# Patient Record
Sex: Male | Born: 1945 | Race: White | Hispanic: No | Marital: Married | Smoking: Never smoker
Health system: Southern US, Community
[De-identification: ages and names within clinical notes are randomized; demographics above are authoritative.]

## PROBLEM LIST (undated history)

## (undated) DIAGNOSIS — G43109 Migraine with aura, not intractable, without status migrainosus: Secondary | ICD-10-CM

## (undated) DIAGNOSIS — R011 Cardiac murmur, unspecified: Secondary | ICD-10-CM

## (undated) DIAGNOSIS — K4090 Unilateral inguinal hernia, without obstruction or gangrene, not specified as recurrent: Secondary | ICD-10-CM

## (undated) DIAGNOSIS — N400 Enlarged prostate without lower urinary tract symptoms: Secondary | ICD-10-CM

## (undated) DIAGNOSIS — I69354 Hemiplegia and hemiparesis following cerebral infarction affecting left non-dominant side: Secondary | ICD-10-CM

## (undated) DIAGNOSIS — E785 Hyperlipidemia, unspecified: Secondary | ICD-10-CM

## (undated) DIAGNOSIS — N281 Cyst of kidney, acquired: Secondary | ICD-10-CM

## (undated) DIAGNOSIS — I5189 Other ill-defined heart diseases: Secondary | ICD-10-CM

## (undated) DIAGNOSIS — I639 Cerebral infarction, unspecified: Secondary | ICD-10-CM

## (undated) HISTORY — PX: OTHER SURGICAL HISTORY: SHX169

## (undated) HISTORY — PX: EYE SURGERY: SHX253

## (undated) HISTORY — PX: COLONOSCOPY W/ POLYPECTOMY: SHX1380

## (undated) HISTORY — PX: TOE SURGERY: SHX1073

---

## 2019-04-12 ENCOUNTER — Encounter: Payer: Self-pay | Admitting: Emergency Medicine

## 2019-04-12 ENCOUNTER — Inpatient Hospital Stay: Payer: Medicare Other

## 2019-04-12 ENCOUNTER — Emergency Department: Payer: Medicare Other

## 2019-04-12 ENCOUNTER — Other Ambulatory Visit: Payer: Self-pay

## 2019-04-12 ENCOUNTER — Inpatient Hospital Stay
Admission: EM | Admit: 2019-04-12 | Discharge: 2019-04-13 | DRG: 066 | Disposition: A | Discharge status: Home / Self Care | Payer: Medicare Other | Attending: Internal Medicine | Admitting: Internal Medicine

## 2019-04-12 DIAGNOSIS — Z823 Family history of stroke: Secondary | ICD-10-CM | POA: Diagnosis not present

## 2019-04-12 DIAGNOSIS — E785 Hyperlipidemia, unspecified: Secondary | ICD-10-CM | POA: Diagnosis present

## 2019-04-12 DIAGNOSIS — Z825 Family history of asthma and other chronic lower respiratory diseases: Secondary | ICD-10-CM | POA: Diagnosis not present

## 2019-04-12 DIAGNOSIS — Z833 Family history of diabetes mellitus: Secondary | ICD-10-CM

## 2019-04-12 DIAGNOSIS — Z8249 Family history of ischemic heart disease and other diseases of the circulatory system: Secondary | ICD-10-CM

## 2019-04-12 DIAGNOSIS — J302 Other seasonal allergic rhinitis: Secondary | ICD-10-CM | POA: Diagnosis present

## 2019-04-12 DIAGNOSIS — R29701 NIHSS score 1: Secondary | ICD-10-CM | POA: Diagnosis present

## 2019-04-12 DIAGNOSIS — R29810 Facial weakness: Secondary | ICD-10-CM | POA: Diagnosis present

## 2019-04-12 DIAGNOSIS — R531 Weakness: Secondary | ICD-10-CM | POA: Diagnosis present

## 2019-04-12 DIAGNOSIS — I63511 Cerebral infarction due to unspecified occlusion or stenosis of right middle cerebral artery: Secondary | ICD-10-CM | POA: Diagnosis present

## 2019-04-12 DIAGNOSIS — R42 Dizziness and giddiness: Secondary | ICD-10-CM

## 2019-04-12 DIAGNOSIS — I639 Cerebral infarction, unspecified: Secondary | ICD-10-CM

## 2019-04-12 LAB — URINALYSIS, ROUTINE W REFLEX MICROSCOPIC
Bacteria, UA: NONE SEEN
Bilirubin Urine: NEGATIVE
Glucose, UA: NEGATIVE mg/dL
Ketones, ur: NEGATIVE mg/dL
Leukocytes,Ua: NEGATIVE
Nitrite: NEGATIVE
Protein, ur: NEGATIVE mg/dL
Specific Gravity, Urine: 1.023 (ref 1.005–1.030)
Squamous Epithelial / HPF: NONE SEEN (ref 0–5)
pH: 5 (ref 5.0–8.0)

## 2019-04-12 LAB — URINE DRUG SCREEN, QUALITATIVE (ARMC ONLY)
Amphetamines, Ur Screen: NOT DETECTED
Barbiturates, Ur Screen: NOT DETECTED
Benzodiazepine, Ur Scrn: NOT DETECTED
Cannabinoid 50 Ng, Ur ~~LOC~~: NOT DETECTED
Cocaine Metabolite,Ur ~~LOC~~: NOT DETECTED
MDMA (Ecstasy)Ur Screen: NOT DETECTED
Methadone Scn, Ur: NOT DETECTED
Opiate, Ur Screen: NOT DETECTED
Phencyclidine (PCP) Ur S: NOT DETECTED
Tricyclic, Ur Screen: NOT DETECTED

## 2019-04-12 LAB — PROTIME-INR
INR: 1 (ref 0.8–1.2)
Prothrombin Time: 13 seconds (ref 11.4–15.2)

## 2019-04-12 LAB — CBC
HCT: 42.1 % (ref 39.0–52.0)
Hemoglobin: 14.3 g/dL (ref 13.0–17.0)
MCH: 30.6 pg (ref 26.0–34.0)
MCHC: 34 g/dL (ref 30.0–36.0)
MCV: 90.1 fL (ref 80.0–100.0)
Platelets: 174 10*3/uL (ref 150–400)
RBC: 4.67 MIL/uL (ref 4.22–5.81)
RDW: 12.6 % (ref 11.5–15.5)
WBC: 4.4 10*3/uL (ref 4.0–10.5)
nRBC: 0 % (ref 0.0–0.2)

## 2019-04-12 LAB — DIFFERENTIAL
Abs Immature Granulocytes: 0.01 10*3/uL (ref 0.00–0.07)
Basophils Absolute: 0 10*3/uL (ref 0.0–0.1)
Basophils Relative: 1 %
Eosinophils Absolute: 0.2 10*3/uL (ref 0.0–0.5)
Eosinophils Relative: 5 %
Immature Granulocytes: 0 %
Lymphocytes Relative: 20 %
Lymphs Abs: 0.9 10*3/uL (ref 0.7–4.0)
Monocytes Absolute: 0.4 10*3/uL (ref 0.1–1.0)
Monocytes Relative: 8 %
Neutro Abs: 2.9 10*3/uL (ref 1.7–7.7)
Neutrophils Relative %: 66 %

## 2019-04-12 LAB — COMPREHENSIVE METABOLIC PANEL
ALT: 21 U/L (ref 0–44)
AST: 22 U/L (ref 15–41)
Albumin: 4.6 g/dL (ref 3.5–5.0)
Alkaline Phosphatase: 56 U/L (ref 38–126)
Anion gap: 6 (ref 5–15)
BUN: 22 mg/dL (ref 8–23)
CO2: 25 mmol/L (ref 22–32)
Calcium: 9.3 mg/dL (ref 8.9–10.3)
Chloride: 107 mmol/L (ref 98–111)
Creatinine, Ser: 1.14 mg/dL (ref 0.61–1.24)
GFR calc Af Amer: >60 mL/min (ref >60)
GFR calc non Af Amer: >60 mL/min (ref >60)
Glucose, Bld: 161 mg/dL — ABNORMAL HIGH (ref 70–99)
Potassium: 3.9 mmol/L (ref 3.5–5.1)
Sodium: 138 mmol/L (ref 135–145)
Total Bilirubin: 1.1 mg/dL (ref 0.3–1.2)
Total Protein: 7.3 g/dL (ref 6.5–8.1)

## 2019-04-12 LAB — APTT: aPTT: 36 seconds (ref 24–36)

## 2019-04-12 LAB — ETHANOL: Alcohol, Ethyl (B): <10 mg/dL (ref <10)

## 2019-04-12 MED ORDER — ACETAMINOPHEN 160 MG/5ML PO SOLN
650.0000 mg | ORAL | Status: DC | PRN
  Filled 2019-04-12: qty 20.3

## 2019-04-12 MED ORDER — ASPIRIN 300 MG RE SUPP: 300.0000 mg | Freq: Every day | RECTAL | Status: DC

## 2019-04-12 MED ORDER — STROKE: EARLY STAGES OF RECOVERY BOOK
Freq: Once | Status: AC
  Administered 2019-04-12: 12:00:00

## 2019-04-12 MED ORDER — ACETAMINOPHEN 650 MG RE SUPP: 650.0000 mg | RECTAL | Status: DC | PRN

## 2019-04-12 MED ORDER — ENOXAPARIN SODIUM 40 MG/0.4ML ~~LOC~~ SOLN
40.0000 mg | SUBCUTANEOUS | Status: DC
  Administered 2019-04-13: 40 mg via SUBCUTANEOUS
  Filled 2019-04-12: qty 0.4

## 2019-04-12 MED ORDER — ACETAMINOPHEN 325 MG PO TABS: 650.0000 mg | ORAL_TABLET | ORAL | Status: DC | PRN

## 2019-04-12 MED ORDER — ASPIRIN 81 MG PO CHEW
324.0000 mg | CHEWABLE_TABLET | Freq: Once | ORAL | Status: AC
  Administered 2019-04-12: 09:00:00 324 mg via ORAL
  Filled 2019-04-12: qty 4

## 2019-04-12 MED ORDER — ASPIRIN 325 MG PO TABS
325.0000 mg | ORAL_TABLET | Freq: Every day | ORAL | Status: DC
  Administered 2019-04-13: 10:00:00 325 mg via ORAL
  Filled 2019-04-12 (×2): qty 1

## 2019-04-12 NOTE — ED Notes (Signed)
Pt taken to CT via stretcher.

## 2019-04-12 NOTE — ED Notes (Signed)
ED TO INPATIENT HANDOFF REPORT  ED Nurse Name and Phone #: Stephany Poorman [phone]  S Name/Age/Gender William Bennett 73 y.o. male Room/Bed: ED17A/ED17A  Code Status   Code Status: Full Code  Home/SNF/Other home A&O x 4 Is this baseline? yes  Triage Complete: Triage complete  Chief Complaint mouth drooping feeling off ballance  Triage Note Pt to ED via POV stating "I think I may have had a stroke yesterday". Pt has obvious droop on the left side of face. Pt reports that he had episode of dizziness yesterday, pt states that this happened around 0930. Pt also reports yesterday that he had trouble holding his left arm up and trouble holding his left leg up to put his shoe on. Pt has slightly decreased grip on the left side. Pt is in NAD at this time.    Allergies Allergies  Allergen Reactions  . Penicillins Rash    Did it involve swelling of the face/tongue/throat, SOB, or low BP? No Did it involve sudden or severe rash/hives, skin peeling, or any reaction on the inside of your mouth or nose? Yes Did you need to seek medical attention at a hospital or doctor's office? Unknown When did it last happen?"Years ago" If all above answers are "NO", may proceed with cephalosporin use.     Level of Care/Admitting Diagnosis ED Disposition    ED Disposition Condition Comment   Admit  Hospital Area: [facility] [[id number]]  Level of Care: Med-Surg [16]  Covid Evaluation: N/A  Diagnosis: Acute ischemic right MCA stroke [facility](HCC) [[id number]][[id number]]  Admitting Physician: Katha HammingKONIDENA, SNEHALATHA [[id number]]  Attending Physician: Katha HammingKONIDENA, SNEHALATHA [[id number]]  Estimated length of stay: past midnight tomorrow  Certification:: I certify this patient will need inpatient services for at least 2 midnights  PT Class (Do Not Modify): Inpatient [101]  PT Acc Code (Do Not Modify): Private [1]       B Medical/Surgery History History reviewed. No pertinent past medical history. Past Surgical  History:  Procedure Laterality Date  . thumb surgery       A IV Location/Drains/Wounds Patient Lines/Drains/Airways Status   Active Line/Drains/Airways    Name:   Placement date:   Placement time:   Site:   Days:   Peripheral IV 04/12/19 Left Antecubital   04/12/19    0816    Antecubital   less than 1          Intake/Output Last 24 hours No intake or output data in the 24 hours ending 04/12/19 1015  Labs/Imaging Results for orders placed or performed during the hospital encounter of 04/12/19 (from the past 48 hour(s))  Protime-INR     Status: None   Collection Time: 04/12/19  8:15 AM  Result Value Ref Range   Prothrombin Time 13.0 11.4 - 15.2 seconds   INR 1.0 0.8 - 1.2    Comment: (NOTE) INR goal varies based on device and disease states. Performed at [facility], [address] [place], [place] [address]   APTT     Status: None   Collection Time: 04/12/19  8:15 AM  Result Value Ref Range   aPTT 36 24 - 36 seconds    Comment: Performed at [facility], [address] [place], [place] [address]  CBC     Status: None   Collection Time: 04/12/19  8:15 AM  Result Value Ref Range   WBC 4.4 4.0 - 10.5 K/uL   RBC 4.67 4.22 - 5.81 MIL/uL   Hemoglobin 14.3 13.0 -  17.0 g/dL   HCT 40.9 81.1 - 91.4 %   MCV 90.1 80.0 - 100.0 fL   MCH 30.6 26.0 - 34.0 pg   MCHC 34.0 30.0 - 36.0 g/dL   RDW 78.2 95.6 - 21.3 %   Platelets 174 150 - 400 K/uL   nRBC 0.0 0.0 - 0.2 %    Comment: Performed at [facility], [address] [place], [place] [address]  Differential     Status: None   Collection Time: 04/12/19  8:15 AM  Result Value Ref Range   Neutrophils Relative % 66 %   Neutro Abs 2.9 1.7 - 7.7 K/uL   Lymphocytes Relative 20 %   Lymphs Abs 0.9 0.7 - 4.0 K/uL   Monocytes Relative 8 %   Monocytes Absolute 0.4 0.1 - 1.0 K/uL   Eosinophils Relative 5 %   Eosinophils Absolute 0.2 0.0 - 0.5 K/uL   Basophils Relative 1 %   Basophils Absolute 0.0 0.0  - 0.1 K/uL   Immature Granulocytes 0 %   Abs Immature Granulocytes 0.01 0.00 - 0.07 K/uL    Comment: Performed at [facility], [address] [place], [place] [address]  Comprehensive metabolic panel     Status: Abnormal   Collection Time: 04/12/19  8:15 AM  Result Value Ref Range   Sodium 138 135 - 145 mmol/L   Potassium 3.9 3.5 - 5.1 mmol/L   Chloride 107 98 - 111 mmol/L   CO2 25 22 - 32 mmol/L   Glucose, Bld 161 (H) 70 - 99 mg/dL   BUN 22 8 - 23 mg/dL   Creatinine, Ser 2.95 0.61 - 1.24 mg/dL   Calcium 9.3 8.9 - 28.4 mg/dL   Total Protein 7.3 6.5 - 8.1 g/dL   Albumin 4.6 3.5 - 5.0 g/dL   AST 22 15 - 41 U/L   ALT 21 0 - 44 U/L   Alkaline Phosphatase 56 38 - 126 U/L   Total Bilirubin 1.1 0.3 - 1.2 mg/dL   GFR calc non Af Amer >60 >60 mL/min   GFR calc Af Amer >60 >60 mL/min   Anion gap 6 5 - 15    Comment: Performed at [facility], [address], [place], [place] [address]   Ct Head Code Stroke Wo Contrast  Result Date: 04/12/2019 CLINICAL DATA:  Code stroke.  LEFT-sided facial droop. Dizziness. EXAM: CT HEAD WITHOUT CONTRAST TECHNIQUE: Contiguous axial images were obtained from the base of the skull through the vertex without intravenous contrast. COMPARISON:  None. FINDINGS: Brain: 5 mm area of hypodensity, in the region of the posterior limb internal capsule on the RIGHT, could represent acute infarct. Elsewhere, no definite acute cortical hypodensity, mass lesion, hemorrhage, hydrocephalus, or extra-axial fluid. Vascular: Calcification of the cavernous internal carotid arteries consistent with cerebrovascular atherosclerotic disease. No signs of intracranial large vessel occlusion. Skull: Intact. Sinuses/Orbits: No acute finding. Other: None. ASPECTS (Alberta Stroke Program Early CT Score) - Ganglionic level infarction (caudate, lentiform nuclei, internal capsule, insula, M1-M3 cortex): 6 - Supraganglionic infarction (M4-M6 cortex): 3 Total score (0-10 with 10  being normal): 9 IMPRESSION: 1. 5 mm area of hypodensity in the posterior limb internal capsule on the RIGHT could represent acute infarction. 2. ASPECTS is 9. 3. These results were called by telephone at the time of interpretation on 04/12/2019 at 9:14 am to Dr. Nita Sickle , who verbally acknowledged these results. * Electronically Signed   By: Elsie Stain M.D.   On: 04/12/2019  09:15    Pending Labs Unresulted Labs (From admission, onward)    Start     Ordered   04/19/19 0500  Creatinine, serum  (enoxaparin (LOVENOX)    CrCl >/= 30 ml/min)  Weekly,   STAT    Comments:  while on enoxaparin therapy    04/12/19 0945   04/13/19 0500  Hemoglobin A1c  Tomorrow morning,   STAT     04/12/19 0945   04/13/19 0500  Lipid panel  Tomorrow morning,   STAT    Comments:  Fasting    04/12/19 0945   04/12/19 0942  HIV antibody (Routine Testing)  Once,   STAT     04/12/19 0945   04/12/19 0942  CBC  (enoxaparin (LOVENOX)    CrCl >/= 30 ml/min)  Once,   STAT    Comments:  Baseline for enoxaparin therapy IF NOT ALREADY DRAWN.  Notify MD if PLT < 100 K.    04/12/19 0945   04/12/19 0942  Creatinine, serum  (enoxaparin (LOVENOX)    CrCl >/= 30 ml/min)  Once,   STAT    Comments:  Baseline for enoxaparin therapy IF NOT ALREADY DRAWN.    04/12/19 0945   04/12/19 0829  Ethanol  ONCE - STAT,   STAT     04/12/19 0830   04/12/19 0829  Urine Drug Screen, Qualitative  Once,   STAT     04/12/19 0830   04/12/19 0829  Urinalysis, Routine w reflex microscopic  ONCE - STAT,   STAT     04/12/19 0830          Vitals/Pain Today's Vitals   04/12/19 0806 04/12/19 0930 04/12/19 0945 04/12/19 1000  BP: (!) 143/94 (!) 160/84 133/70 136/75  Pulse: 86 78 (!) 59 (!) 57  Resp: 16 (!) 24 (!) 22 19  Temp: 97.9 F (36.6 C)     TempSrc: Oral     SpO2: 98% 97% 99% 98%  Weight: 79.4 kg     Height: 5\' 9"  (1.753 m)     PainSc: 0-No pain       Isolation Precautions No active isolations  Medications Medications    stroke: mapping our early stages of recovery book (has no administration in time range)  acetaminophen (TYLENOL) tablet 650 mg (has no administration in time range)    Or  acetaminophen (TYLENOL) solution 650 mg (has no administration in time range)    Or  acetaminophen (TYLENOL) suppository 650 mg (has no administration in time range)  enoxaparin (LOVENOX) injection 40 mg (has no administration in time range)  aspirin suppository 300 mg (has no administration in time range)    Or  aspirin tablet 325 mg (has no administration in time range)  aspirin chewable tablet 324 mg (324 mg Oral Given 04/12/19 0923)    Mobility  Low fall risk      R

## 2019-04-12 NOTE — ED Notes (Signed)
Floor contacted to notify them pt being transported

## 2019-04-12 NOTE — ED Notes (Addendum)
Pt transported to MRI at this time 

## 2019-04-12 NOTE — ED Notes (Signed)
Pt moved to room 126 by this tech.

## 2019-04-12 NOTE — ED Provider Notes (Signed)
[facility] Emergency Department Provider Note  ____________________________________________  Time seen: Approximately 9:19 AM  I have reviewed the triage vital signs and the nursing notes.   HISTORY  Chief Complaint possible CVA   HPI William Bennett is a 73 y.o. male with a history of hyperlipidemia and migraine headaches who presents for evaluation of left-sided facial droop and dizziness.  Patient reports that he felt off balance the entire day yesterday while packing his house for moving.  He noticed mild weakness on the left upper and lower extremity.  He does have a history of vertigo and attributed his symptoms to that.  This morning when he woke up he noted a left-sided facial droop for which prompted his visit to the emergency room.  He is still complaining of intermittent episodes of feeling off balance.  Denies slurred speech.  He has family history of stroke.  Denies any history of smoking.  Denies any personal history of stroke.  PMH HLD Migraine HA  Past Surgical History:  Procedure Laterality Date   thumb surgery      Allergies Penicillins  FH Asthma Father    Coronary Artery Disease (Blocked arteries around heart) Father    Coronary Artery Disease (Blocked arteries around heart) Mother    Diabetes type II Mother    High blood pressure (Hypertension) Mother    Depression Sister    TIA - mother   Social History Social History   Tobacco Use   Smoking status: Never Smoker   Smokeless tobacco: Never Used  Substance Use Topics   Alcohol use: Not Currently   Drug use: Not Currently    Review of Systems  Constitutional: Negative for fever. Eyes: Negative for visual changes. ENT: Negative for sore throat. Neck: No neck pain  Cardiovascular: Negative for chest pain. Respiratory: Negative for shortness of breath. Gastrointestinal: Negative for abdominal pain, vomiting or diarrhea. Genitourinary: Negative for  dysuria. Musculoskeletal: Negative for back pain. Skin: Negative for rash. Neurological: Negative for headaches. + vertigo, L sided facial droop. Psych: No SI or HI  ____________________________________________   PHYSICAL EXAM:  VITAL SIGNS: ED Triage Vitals [04/12/19 0806]  Enc Vitals Group     BP (!) 143/94     Pulse Rate 86     Resp 16     Temp 97.9 F (36.6 C)     Temp Source Oral     SpO2 98 %     Weight 175 lb (79.4 kg)     Height  (1.753 m)     Head Circumference      Peak Flow      Pain Score 0     Pain Loc      Pain Edu?      Excl. in GC?     Constitutional: Alert and oriented. Well appearing and in no apparent distress. HEENT:      Head: Normocephalic and atraumatic.         Eyes: Conjunctivae are normal. Sclera is non-icteric.       Mouth/Throat: Mucous membranes are moist.       Neck: Supple with no signs of meningismus. Cardiovascular: Regular rate and rhythm. No murmurs, gallops, or rubs. 2+ symmetrical distal pulses are present in all extremities. No JVD. Respiratory: Normal respiratory effort. Lungs are clear to auscultation bilaterally. No wheezes, crackles, or rhonchi.  Gastrointestinal: Soft, non tender, and non distended with positive bowel sounds. No rebound or guarding. Musculoskeletal: Nontender with normal range of motion in  all extremities. No edema, cyanosis, or erythema of extremities. Neurologic: Normal speech and language. R lower facial droop, forehead is spared, strength 5/5 x 4, normal sensation, no pronator drift or dysmetria, gait is intact Skin: Skin is warm, dry and intact. No rash noted. Psychiatric: Mood and affect are normal. Speech and behavior are normal.  ____________________________________________   LABS (all labs ordered are listed, but only abnormal results are displayed)  Labs Reviewed  COMPREHENSIVE METABOLIC PANEL - Abnormal; Notable for the following components:      Result Value   Glucose, Bld 161 (*)    All  other components within normal limits  PROTIME-INR  APTT  CBC  DIFFERENTIAL  ETHANOL  URINE DRUG SCREEN, QUALITATIVE ([facility] ONLY)  URINALYSIS, ROUTINE W REFLEX MICROSCOPIC  CBG MONITORING, ED   ____________________________________________  EKG  ED ECG REPORT I, Nita Sickle, the attending physician, personally viewed and interpreted this ECG.  Normal sinus rhythm, rate of 73, normal intervals, normal axis, no ST elevations or depressions.  Normal EKG. ____________________________________________  RADIOLOGY  I have personally reviewed the images performed during this visit and I agree with the Radiologist's read.   Interpretation by Radiologist:  Ct Head Code Stroke Wo Contrast  Result Date: 04/12/2019 CLINICAL DATA:  Code stroke.  LEFT-sided facial droop. Dizziness. EXAM: CT HEAD WITHOUT CONTRAST TECHNIQUE: Contiguous axial images were obtained from the base of the skull through the vertex without intravenous contrast. COMPARISON:  None. FINDINGS: Brain: 5 mm area of hypodensity, in the region of the posterior limb internal capsule on the RIGHT, could represent acute infarct. Elsewhere, no definite acute cortical hypodensity, mass lesion, hemorrhage, hydrocephalus, or extra-axial fluid. Vascular: Calcification of the cavernous internal carotid arteries consistent with cerebrovascular atherosclerotic disease. No signs of intracranial large vessel occlusion. Skull: Intact. Sinuses/Orbits: No acute finding. Other: None. ASPECTS (Alberta Stroke Program Early CT Score) - Ganglionic level infarction (caudate, lentiform nuclei, internal capsule, insula, M1-M3 cortex): 6 - Supraganglionic infarction (M4-M6 cortex): 3 Total score (0-10 with 10 being normal): 9 IMPRESSION: 1. 5 mm area of hypodensity in the posterior limb internal capsule on the RIGHT could represent acute infarction. 2. ASPECTS is 9. 3. These results were called by telephone at the time of interpretation on 04/12/2019 at 9:14  am to Dr. Nita Sickle , who verbally acknowledged these results. * Electronically Signed   By: Elsie Stain M.D.   On: 04/12/2019 09:15     ____________________________________________   PROCEDURES  Procedure(s) performed: None Procedures Critical Care performed:  None ____________________________________________   INITIAL IMPRESSION / ASSESSMENT AND PLAN / ED COURSE  73 y.o. male with a history of hyperlipidemia and migraine headaches who presents for evaluation of left-sided facial droop and dizziness. Last normal was sometime yesterday. Patient has lower facial droop on the left with no other neuro deficits. CT concerning for acute R sided internal capsule stroke. Will give ASA. Labs showing mildly elevated BG 161. EKG with no evidence of arrhythmia. Will admit to the hospitalist service for stroke evaluation.      As part of my medical decision making, I reviewed the following data within the electronic MEDICAL RECORD NUMBER Nursing notes reviewed and incorporated, Labs reviewed , EKG interpreted , Old chart reviewed, Radiograph reviewed , Discussed with admitting physician , Notes from prior ED visits and [place] Controlled Substance Database    Pertinent labs & imaging results that were available during my care of the patient were reviewed by me and considered in my medical decision making (  see chart for details).    ____________________________________________   FINAL CLINICAL IMPRESSION(S) / ED DIAGNOSES  Final diagnoses:  Cerebrovascular accident (CVA), unspecified mechanism (HCC)      NEW MEDICATIONS STARTED DURING THIS VISIT:  ED Discharge Orders    None       Note:  This document was prepared using Dragon voice recognition software and may include unintentional dictation errors.    Don PerkingVeronese, [place], MD 04/12/19 [phone]

## 2019-04-12 NOTE — Evaluation (Signed)
Physical Therapy Evaluation Patient Details Name: William Bennett MRN: [id number] DOB: 1946/02/03 Today's Date: 04/12/2019   History of Present Illness  Patient is a pleasant 73 year old male patient who presented for left facial droop and dizziness.  CT head done in the emergency room showed acute stroke of 1.5 mm in right internal capsule. 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 Patient is very active, working out 6 days a week.   Clinical Impression  Patient is a very pleasant motivated 74 year old male who presents with decreased stability and coordination with LLE secondary to acute stroke. Patient ambulated and transferred safely with noted use of RLE>LLE. Decreased ability to perform tandem stance notes patient is not quite at his baseline. Patient demonstrates safe mobility inside and outside of room indicating no further need for inpatient physical therapy, however due to patient's high previous level of function outpatient physical therapy for balance and coordination for his LLE would be beneficial upon discharge.    Follow Up Recommendations Outpatient PT    Equipment Recommendations  None recommended by PT    Recommendations for Other Services       Precautions / Restrictions Precautions Precautions: None Restrictions Weight Bearing Restrictions: No      Mobility  Bed Mobility Overal bed mobility: Independent             General bed mobility comments: no difficulty  Transfers Overall transfer level: Independent Equipment used: None             General transfer comment: patient independent with transfers   Ambulation/Gait Ambulation/Gait assistance: Supervision;Independent Gait Distance (Feet): 180 Feet Assistive device: None Gait Pattern/deviations: Decreased weight shift to left   Gait velocity interpretation: >4.37 ft/sec, indicative of normal walking speed General Gait Details: Patient's gait is functional, noted decreased toe off of LLE during initial  swing phase, and decreased stance time on LLE   Stairs            Wheelchair Mobility    Modified Rankin (Stroke Patients Only)       Balance Overall balance assessment: Modified Independent Sitting-balance support: No upper extremity supported;Feet unsupported Sitting balance-Leahy Scale: Normal Sitting balance - Comments: good reach inside and outside BOS    Standing balance support: No upper extremity supported;During functional activity Standing balance-Leahy Scale: Fair     Single Leg Stance - Left Leg: (unable to maintain >3 seconds)   Tandem Stance - Left Leg: (unable to maintain)   Rhomberg - Eyes Closed: 30(WFL)   High Level Balance Comments: Patient is limited in tandem stance and single limb stance on LLE, able to perform on RLE.              Pertinent Vitals/Pain Pain Assessment: No/denies pain    Home Living Family/patient expects to be discharged to:: Private residence Living Arrangements: Spouse/significant other Available Help at Discharge: Family;Available 24 hours/day Type of Home: House(pt reports moving to [facility] very soon) Home Access: (will be a level entry)     Home Layout: Able to live on main level with bedroom/bathroom Home Equipment: None Additional Comments: Patient and wife are independent with all mobility. In the process of moving to [facility]    Prior Function Level of Independence: Independent         Comments: Pt independent and active, goes to gym with wife 6x/wk. walks dog daily, practices balance 2x a week      Hand Dominance   Dominant Hand: Right    Extremity/Trunk  Assessment   Upper Extremity Assessment Upper Extremity Assessment: Defer to OT evaluation LUE Deficits / Details: sensation and strength intact, very minor FMC deficits noted with thumb opposition testing LUE Sensation: WNL LUE Coordination: decreased fine motor    Lower Extremity Assessment Lower Extremity Assessment: RLE  deficits/detail;LLE deficits/detail RLE Deficits / Details: 5/5 generalized strength  RLE Sensation: WNL RLE Coordination: WNL LLE Deficits / Details: 4/5 grossly with ankle 4-/5  LLE Sensation: WNL LLE Coordination: decreased gross motor    Cervical / Trunk Assessment Cervical / Trunk Assessment: Normal  Communication   Communication: No difficulties(patient reports his voice feels a little different)  Cognition Arousal/Alertness: Awake/alert Behavior During Therapy: WFL for tasks assessed/performed Overall Cognitive Status: Within Functional Limits for tasks assessed                                 General Comments: A and O x 4, cognitively intact, eager to participate      General Comments General comments (skin integrity, edema, etc.): unable to stick tongue out straight, slight facial drooping to left     Exercises Other Exercises Other Exercises: patient instructed in balance interventions with importance of utilizing nearby counter or chair for support  Other Exercises: pt instructed in meditative/pursed lip breathing to support self mgt of life stress, pt endorses last year was difficult, as he was working hard and lost his nephew. Pt became tearful, emotional support and active listening provided. Pt appeared less visibly upset by end of session.    Assessment/Plan    PT Assessment All further PT needs can be met in the next venue of care  PT Problem List Decreased strength;Decreased coordination;Decreased balance       PT Treatment Interventions      PT Goals (Current goals can be found in the Care Plan section)  Acute Rehab PT Goals Patient Stated Goal: to return home  PT Goal Formulation: With patient Time For Goal Achievement: 04/26/19 Potential to Achieve Goals: Good Additional Goals Additional Goal #1: Patient will demonstrate safe ambulation and mobility for ability to negotiate household environment.    Frequency     Barriers to  discharge        Co-evaluation               AM-PAC PT "6 Clicks" Mobility  Outcome Measure Help needed turning from your back to your side while in a flat bed without using bedrails?: None Help needed moving from lying on your back to sitting on the side of a flat bed without using bedrails?: None Help needed moving to and from a bed to a chair (including a wheelchair)?: None Help needed standing up from a chair using your arms (e.g., wheelchair or bedside chair)?: None Help needed to walk in hospital room?: None Help needed climbing 3-5 steps with a railing? : A Little 6 Click Score: 23    End of Session Equipment Utilized During Treatment: Gait belt Activity Tolerance: Patient tolerated treatment well Patient left: in bed;with call bell/phone within reach Nurse Communication: Mobility status(no need for further inpatient PT, recommending outpatient PT) PT Visit Diagnosis: Unsteadiness on feet (R26.81);Other abnormalities of gait and mobility (R26.89);Muscle weakness (generalized) (M62.81)    Time: 3545-6256 PT Time Calculation (min) (ACUTE ONLY): 21 min   Charges:   PT Evaluation $PT Eval Low Complexity: [place], PT, DPT  Janna Arch 04/12/2019, 4:15 PM

## 2019-04-12 NOTE — ED Triage Notes (Signed)
Pt to ED via POV stating "I think I may have had a stroke yesterday". Pt has obvious droop on the left side of face. Pt reports that he had episode of dizziness yesterday, pt states that this happened around 0930. Pt also reports yesterday that he had trouble holding his left arm up and trouble holding his left leg up to put his shoe on. Pt has slightly decreased grip on the left side. Pt is in NAD at this time.

## 2019-04-12 NOTE — Progress Notes (Signed)
PT Cancellation Note  Patient Details Name: Jamarkus Hymer MRN: [id number] DOB: [birth year]   Cancelled Treatment:    Reason Eval/Treat Not Completed: Patient at procedure or test/unavailable(patient out of room) Patient out of room , will attempt again at later time/date.   Precious Bard, PT, DPT   04/12/2019, 1:30 PM

## 2019-04-12 NOTE — ED Notes (Signed)
Report given to [facility] RN at this time, pt still at MRI, will transport pt upon return to ED

## 2019-04-12 NOTE — ED Notes (Signed)
Pt given phone to call wife.

## 2019-04-12 NOTE — Evaluation (Signed)
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   Clinical Impression   Pt seen for OT evaluation this date. Prior to hospital admission, pt was independent and active in all aspects of ADL/IADL and driving. Pt denies falls history in past 12 months. Pt lives with his spouse, they go to the gym to work out 6 days per week and pt reports they are both moving into [facility] soon. Currently pt demonstrates very mild impairments in L sided coordination. This deficits does not significantly impair the pt's ability to perform ADL tasks, but increases the challenge. Pt instructed in meditative/pursed lip breathing to support self mgt of life stress, pt endorses last year was difficult, as he was working hard and lost his nephew. Pt became tearful, emotional support and active listening provided. Pt appeared less visibly upset by end of session. No difficulty with ADL mobility during session. Pt appreciative for support and verbalizes plan to perform there-ex demonstrated. No follow up indicated. Will sign off. Please re-consult if additional needs arise.     Follow Up Recommendations  No OT follow up    Equipment Recommendations  None recommended by OT    Recommendations for Other Services       Precautions / Restrictions Precautions Precautions: None Restrictions Weight Bearing Restrictions: No      Mobility Bed Mobility Overal bed mobility: Independent             General bed mobility comments: no difficulty  Transfers Overall transfer level: Independent Equipment used: None             General transfer comment: with initial cue to scoot EOB prior to  attempt, pt indep with STS transfers    Balance Overall balance assessment: No apparent balance deficits (not formally assessed)                                         ADL either performed or assessed with clinical judgement   ADL Overall ADL's : Independent                                       General ADL Comments: generally independent, very minor FMC deficits not significant enough to impair this independence with ADL and ADL mobility     Vision Baseline Vision/History: No visual deficits Patient Visual Report: No change from baseline       Perception     Praxis      Pertinent Vitals/Pain Pain Assessment: No/denies pain     Hand Dominance Right   Extremity/Trunk Assessment Upper Extremity Assessment Upper Extremity Assessment: Overall WFL for tasks assessed;LUE deficits/detail LUE Deficits / Details: sensation and strength intact, very minor FMC deficits noted with thumb opposition testing LUE Sensation: WNL LUE Coordination: decreased fine motor   Lower Extremity Assessment Lower Extremity Assessment: Generalized weakness;LLE deficits/detail LLE Deficits / Details: sensation and strength intact, very minor FMC deficits noted with thumb opposition testing LLE Sensation: WNL LLE Coordination: decreased fine motor   Cervical /  Trunk Assessment Cervical / Trunk Assessment: Normal   Communication Communication Communication: No difficulties(pt reports he can notice a very slight slur in his speech 2/2 the mild L facial droop)   Cognition Arousal/Alertness: Awake/alert Behavior During Therapy: WFL for tasks assessed/performed Overall Cognitive Status: Within Functional Limits for tasks assessed                                 General Comments: alert and oriented, follows all commands, well spoken and attentive to session   General Comments       Exercises Other Exercises Other Exercises: pt instructed in [facility]  ther-ex for LUE to maximize return to PLOF - pt demo'd understanding Other Exercises: pt instructed in meditative/pursed lip breathing to support self mgt of life stress, pt endorses last year was difficult, as he was working hard and lost his nephew. Pt became tearful, emotional support and active listening provided. Pt appeared less visibly upset by end of session.    Shoulder Instructions      Home Living Family/patient expects to be discharged to:: Private residence Living Arrangements: Spouse/significant other Available Help at Discharge: Family;Available 24 hours/day Type of Home: House(pt reports moving to [facility] very soon)                       Home Equipment: None          Prior Functioning/Environment Level of Independence: Independent        Comments: Pt independent and active, goes to gym with wife 6x/wk        OT Problem List: Decreased coordination      OT Treatment/Interventions:      OT Goals(Current goals can be found in the care plan section) Acute Rehab OT Goals Patient Stated Goal: return home OT Goal Formulation: All assessment and education complete, DC therapy  OT Frequency:     Barriers to D/C:            Co-evaluation              AM-PAC OT "6 Clicks" Daily Activity     Outcome Measure Help from another person eating meals?: None Help from another person taking care of personal grooming?: None Help from another person toileting, which includes using toliet, bedpan, or urinal?: None Help from another person bathing (including washing, rinsing, drying)?: None Help from another person to put on and taking off regular upper body clothing?: None Help from another person to put on and taking off regular lower body clothing?: None 6 Click Score: 24   End of Session    Activity Tolerance: Patient tolerated treatment well Patient left: in bed;with call bell/phone within reach  OT Visit Diagnosis: Other abnormalities of gait  and mobility (R26.89)                Time: 5364-6803 OT Time Calculation (min): 23 min Charges:  OT General Charges $OT Visit: 1 Visit OT Evaluation $OT Eval Low Complexity: 1 Low OT Treatments $Neuromuscular Re-education: 8-22 mins  Richrd Prime, MPH, MS, OTR/L ascom [phone] 04/12/19, 2:25 PM

## 2019-04-12 NOTE — ED Notes (Signed)
Attempted to call report, receiving nurse states she was in pt room and would have to call back

## 2019-04-12 NOTE — ED Notes (Signed)
Pt states felt off balance yesterday. Hx vertigo. States went to bed around 1am, woke up at 7:07 and noticed L sided mouth droop. Speaking in clear complete sentences. Denies numbness or weakness. States still feels off balance. Moving all extremities on own. No drift noted. Able to stand and move around. No vision changes.

## 2019-04-12 NOTE — H&P (Signed)
[facility] - [place] at [facility]   PATIENT NAME: William Bennett    MR#:  [id number]  DATE OF BIRTH:  30-Mar-1946  DATE OF ADMISSION:  04/12/2019  PRIMARY CARE PHYSICIAN: Kandyce Rud, MD   REQUESTING/REFERRING PHYSICIAN: Dr. Darden Dates  CHIEF COMPLAINT: Dizziness, left facial droop.   Chief Complaint  Patient presents with  . possible CVA    HISTORY OF PRESENT ILLNESS:  William Bennett  is a 73 y.o. male with a known history of seasonal allergies noticed dizziness yesterday, today morning he noticed left facial droop.  The patient also felt slight weakness of left hand, left leg yesterday but did not think much about it.  Denies headache, double vision, no weakness on the right side.  Patient is in the middle of moving houses and says that he is lifting boxes.  Denies any tingling, numbness. PAST MEDICAL HISTORY:  History reviewed. No pertinent past medical history.  PAST SURGICAL HISTOIRY:   Past Surgical History:  Procedure Laterality Date  . thumb surgery      SOCIAL HISTORY:   Social History   Tobacco Use  . Smoking status: Never Smoker  . Smokeless tobacco: Never Used  Substance Use Topics  . Alcohol use: Not Currently    FAMILY HISTORY:  No family history on file.  DRUG ALLERGIES:   Allergies  Allergen Reactions  . Penicillins Rash    Did it involve swelling of the face/tongue/throat, SOB, or low BP? No Did it involve sudden or severe rash/hives, skin peeling, or any reaction on the inside of your mouth or nose? Yes Did you need to seek medical attention at a hospital or doctor's office? Unknown When did it last happen?"Years ago" If all above answers are "NO", may proceed with cephalosporin use.     REVIEW OF SYSTEMS:  CONSTITUTIONAL: No fever, fatigue or weakness.  EYES: No blurred or double vision.  EARS, NOSE, AND THROAT: No tinnitus or ear pain.  RESPIRATORY: No cough, shortness of breath, wheezing or hemoptysis.   CARDIOVASCULAR: No chest pain, orthopnea, edema.  GASTROINTESTINAL: No nausea, vomiting, diarrhea or abdominal pain.  GENITOURINARY: No dysuria, hematuria.  ENDOCRINE: No polyuria, nocturia,  HEMATOLOGY: No anemia, easy bruising or bleeding SKIN: No rash or lesion. MUSCULOSKELETAL: No joint pain or arthritis.   NEUROLOGIC left facial droop, left-sided weakness, dizziness, symptoms resolved now.  PSYCHIATRY: No anxiety or depression.   MEDICATIONS AT HOME:   Prior to Admission medications   Medication Sig Start Date End Date Taking? Authorizing Provider  fluticasone (FLONASE) 50 MCG/ACT nasal spray Place 1-2 sprays into both nostrils daily.   Yes [provider]  cetirizine (ZYRTEC) 10 MG tablet Take 10 mg by mouth daily.    [provider]      VITAL SIGNS:  Blood pressure (!) 145/83, pulse (!) 59, temperature 98.3 F (36.8 C), temperature source Oral, resp. rate 17, height 5\' 9"  (1.753 m), weight 81.4 kg, SpO2 100 %.  PHYSICAL EXAMINATION:  GENERAL:  73 y.o.-year-old patient lying in the bed with no acute distress.  EYES: Pupils equal, round, reactive to light and accommodation. No scleral icterus. Extraocular muscles intact.  HEENT: Head atraumatic, normocephalic. Oropharynx and nasopharynx clear.  NECK:  Supple, no jugular venous distention. No thyroid enlargement, no tenderness.  LUNGS: Normal breath sounds bilaterally, no wheezing, rales,rhonchi or crepitation. No use of accessory muscles of respiration.  CARDIOVASCULAR: S1, S2 normal. No murmurs, rubs, or gallops.  ABDOMEN: Soft, nontender, nondistended. Bowel sounds present. No  organomegaly or mass.  EXTREMITIES: No pedal edema, cyanosis, or clubbing.  NEUROLOGIC: Cranial nerves II through XII are intact. Muscle strength 5/5 in all extremities. Sensation intact. Gait not checked.  PSYCHIATRIC: The patient is alert and oriented x 3.  SKIN: No obvious rash, lesion, or ulcer.   LABORATORY PANEL:    CBC Recent Labs  Lab 04/12/19 0815  WBC 4.4  HGB 14.3  HCT 42.1  PLT 174   ------------------------------------------------------------------------------------------------------------------  Chemistries  Recent Labs  Lab 04/12/19 0815  NA 138  K 3.9  CL 107  CO2 25  GLUCOSE 161*  BUN 22  CREATININE 1.14  CALCIUM 9.3  AST 22  ALT 21  ALKPHOS 56  BILITOT 1.1   ------------------------------------------------------------------------------------------------------------------  Cardiac Enzymes No results for input(s): TROPONINI in the last 168 hours. ------------------------------------------------------------------------------------------------------------------  RADIOLOGY:  Mr Brain Wo Contrast  Result Date: 04/12/2019 CLINICAL DATA:  LEFT-sided facial droop.  Dizziness. EXAM: MRI HEAD WITHOUT CONTRAST MRA HEAD WITHOUT CONTRAST TECHNIQUE: Multiplanar, multiecho pulse sequences of the brain and surrounding structures were obtained without intravenous contrast. Angiographic images of the head were obtained using MRA technique without contrast. COMPARISON:  CT head earlier today. FINDINGS: MRI HEAD FINDINGS Brain: Large area of restricted diffusion, RIGHT paramedian mid to upper pons, overlapping low ADC, consistent with acute infarction. The infarct is moderate-sized, 6 x 11 mm cross-section. No acute infarct is seen in the internal capsule as queried by CT. The observed brainstem infarct is not visible on earlier CT. There is no associated hemorrhage, and no areas of acute infarction elsewhere. No hemorrhage, mass lesion, hydrocephalus, or extra-axial fluid. Mild cerebral and cerebellar atrophy. Mild to moderate subcortical and periventricular T2 and FLAIR hyperintensities, likely chronic microvascular ischemic change. Vascular: Reported separately. Skull and upper cervical spine: Normal marrow signal. Sinuses/Orbits: Negative. Other: None. MRA HEAD FINDINGS The internal carotid  arteries demonstrate wide patency throughout the cervical, petrous, cavernous, and supraclinoid internal carotid artery segments. The proximal anterior or middle cerebral arteries are widely patent including their major branches. The basilar artery is widely patent throughout its course, with vertebrals codominant. No evidence of stenosis or dissection. Cerebellar branches are poorly visualized except for BILATERAL PICA. Moderate disease is suspected of the RIGHT superior cerebellar and BILATERAL AICA cerebellar arteries. No visible saccular aneurysm. IMPRESSION: MRI brain demonstrates an acute nonhemorrhagic infarct of the RIGHT mid to upper pons. This is likely a small vessel insult. Elsewhere, mild atrophy with mild to moderate small vessel disease. MR angiography intracranial circulation demonstrates no focal large vessel stenosis or basilar dissection. Electronically Signed   By: Elsie Stain M.D.   On: 04/12/2019 11:10   Mr Maxine Glenn Head/brain NF Cm  Result Date: 04/12/2019 CLINICAL DATA:  LEFT-sided facial droop.  Dizziness. EXAM: MRI HEAD WITHOUT CONTRAST MRA HEAD WITHOUT CONTRAST TECHNIQUE: Multiplanar, multiecho pulse sequences of the brain and surrounding structures were obtained without intravenous contrast. Angiographic images of the head were obtained using MRA technique without contrast. COMPARISON:  CT head earlier today. FINDINGS: MRI HEAD FINDINGS Brain: Large area of restricted diffusion, RIGHT paramedian mid to upper pons, overlapping low ADC, consistent with acute infarction. The infarct is moderate-sized, 6 x 11 mm cross-section. No acute infarct is seen in the internal capsule as queried by CT. The observed brainstem infarct is not visible on earlier CT. There is no associated hemorrhage, and no areas of acute infarction elsewhere. No hemorrhage, mass lesion, hydrocephalus, or extra-axial fluid. Mild cerebral and cerebellar atrophy. Mild to moderate subcortical and periventricular  T2 and FLAIR  hyperintensities, likely chronic microvascular ischemic change. Vascular: Reported separately. Skull and upper cervical spine: Normal marrow signal. Sinuses/Orbits: Negative. Other: None. MRA HEAD FINDINGS The internal carotid arteries demonstrate wide patency throughout the cervical, petrous, cavernous, and supraclinoid internal carotid artery segments. The proximal anterior or middle cerebral arteries are widely patent including their major branches. The basilar artery is widely patent throughout its course, with vertebrals codominant. No evidence of stenosis or dissection. Cerebellar branches are poorly visualized except for BILATERAL PICA. Moderate disease is suspected of the RIGHT superior cerebellar and BILATERAL AICA cerebellar arteries. No visible saccular aneurysm. IMPRESSION: MRI brain demonstrates an acute nonhemorrhagic infarct of the RIGHT mid to upper pons. This is likely a small vessel insult. Elsewhere, mild atrophy with mild to moderate small vessel disease. MR angiography intracranial circulation demonstrates no focal large vessel stenosis or basilar dissection. Electronically Signed   By: Elsie StainJohn T Curnes M.D.   On: 04/12/2019 11:10   Ct Head Code Stroke Wo Contrast  Result Date: 04/12/2019 CLINICAL DATA:  Code stroke.  LEFT-sided facial droop. Dizziness. EXAM: CT HEAD WITHOUT CONTRAST TECHNIQUE: Contiguous axial images were obtained from the base of the skull through the vertex without intravenous contrast. COMPARISON:  None. FINDINGS: Brain: 5 mm area of hypodensity, in the region of the posterior limb internal capsule on the RIGHT, could represent acute infarct. Elsewhere, no definite acute cortical hypodensity, mass lesion, hemorrhage, hydrocephalus, or extra-axial fluid. Vascular: Calcification of the cavernous internal carotid arteries consistent with cerebrovascular atherosclerotic disease. No signs of intracranial large vessel occlusion. Skull: Intact. Sinuses/Orbits: No acute finding.  Other: None. ASPECTS (Alberta Stroke Program Early CT Score) - Ganglionic level infarction (caudate, lentiform nuclei, internal capsule, insula, M1-M3 cortex): 6 - Supraganglionic infarction (M4-M6 cortex): 3 Total score (0-10 with 10 being normal): 9 IMPRESSION: 1. 5 mm area of hypodensity in the posterior limb internal capsule on the RIGHT could represent acute infarction. 2. ASPECTS is 9. 3. These results were called by telephone at the time of interpretation on 04/12/2019 at 9:14 am to Dr. Nita SickleAROLINA VERONESE , who verbally acknowledged these results. * Electronically Signed   By: Elsie StainJohn T Curnes M.D.   On: 04/12/2019 09:15    EKG:   Orders placed or performed during the hospital encounter of 04/12/19  . ED EKG  . ED EKG  EKG shows normal sinus rhythm at 73 bpm, no ST-T changes.  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 1.  Acute right MCA: Admitted to stroke unit, initially he had decreased grip on left side but has no weakness appreciated now, continue aspirin, neurochecks, follow NIH stroke scale, continue stroke work-up including MRI of the brain, MRA of the brain, echocardiogram, physical therapy and speech therapy evaluation.  Hopefully if everything comes back normal he should be able to go tomorrow.  Patient right now has no focal deficit observed.  Continue aspirin.     All the records are reviewed and case discussed with ED provider. Management plans discussed with the patient, family and they are in agreement.  CODE STATUS: Full  TOTAL TIME TAKING CARE OF THIS PATIENT: 55 minutes.    Katha HammingSnehalatha Sigifredo Pignato M.D on 04/12/2019 at 12:21 PM  Between 7am to 6pm - Pager - [phone]  After 6pm go to [email or web address] - password [email or web address] [facility]  [facility]  Office  [phone]  CC: Primary care physician;  Kandyce Rud, MD  Note: This dictation was prepared with Dragon dictation along with smaller phrase technology. Any transcriptional errors that result from this process are unintentional.

## 2019-04-12 NOTE — Progress Notes (Signed)
OT Cancellation Note  Patient Details Name: William Bennett MRN: [id number] DOB: [birth year]   Cancelled Treatment:    Reason Eval/Treat Not Completed: Patient at procedure or test/ unavailable. Order received, chart reviewed. Pt out of room for testing. Will re-attempt OT evaluation at later date/time as pt is available and medically appropriate.  Richrd Prime, MPH, MS, OTR/L ascom [phone] 04/12/19, 1:29 PM

## 2019-04-13 ENCOUNTER — Inpatient Hospital Stay (HOSPITAL_COMMUNITY)
Admit: 2019-04-13 | Discharge: 2019-04-13 | Disposition: A | Discharge status: Home / Self Care | Payer: Medicare Other | Attending: Internal Medicine | Admitting: Internal Medicine

## 2019-04-13 DIAGNOSIS — I63511 Cerebral infarction due to unspecified occlusion or stenosis of right middle cerebral artery: Secondary | ICD-10-CM

## 2019-04-13 LAB — LIPID PANEL
Cholesterol: 190 mg/dL (ref 0–200)
HDL: 52 mg/dL (ref >40)
LDL Cholesterol: 128 mg/dL — ABNORMAL HIGH (ref 0–99)
Total CHOL/HDL Ratio: 3.7 RATIO
Triglycerides: 49 mg/dL (ref <150)
VLDL: 10 mg/dL (ref 0–40)

## 2019-04-13 LAB — ECHOCARDIOGRAM COMPLETE
Height: 69 in
Weight: 2871.27 oz

## 2019-04-13 LAB — HEMOGLOBIN A1C
Hgb A1c MFr Bld: 5.1 % (ref 4.8–5.6)
Mean Plasma Glucose: 99.67 mg/dL

## 2019-04-13 MED ORDER — ASPIRIN EC 81 MG PO TBEC: 81.0000 mg | DELAYED_RELEASE_TABLET | Freq: Every day | ORAL | 2 refills | Status: AC

## 2019-04-13 MED ORDER — ATORVASTATIN CALCIUM 40 MG PO TABS: 40.0000 mg | ORAL_TABLET | Freq: Every day | ORAL | 1 refills | Status: DC

## 2019-04-13 NOTE — Discharge Summary (Signed)
William Bennett, is a 73 y.o. male  DOB [birth year]  MRN [id number].  Admission date:  04/12/2019  Admitting Physician  Katha Hamming, MD  Discharge Date:  04/13/2019   Primary MD  Kandyce Rud, MD  Recommendations for primary care physician for things to follow:   Follow with PCP in 1 week   Admission Diagnosis  Dizziness [R42] Cerebrovascular accident (CVA), unspecified mechanism (HCC) [I63.9]   Discharge Diagnosis  Dizziness [R42] Cerebrovascular accident (CVA), unspecified mechanism (HCC) [I63.9]    Active Problems:   Acute ischemic right MCA stroke (HCC)      History reviewed. No pertinent past medical history.  Past Surgical History:  Procedure Laterality Date  . thumb surgery         History of present illness and  Hospital Course:     Kindly see H&P for history of present illness and admission details, please review complete Labs, Consult reports and Test reports for all details in brief  HPI  from the history and physical done on the day of admission 73 year old well patient admitted because of left facial droop, dizziness, slight weakness of left hand and found to have acute stroke in the right internal capsule, admitted for acute stroke.   Hospital Course  #1, acute right MCA, patient initially presented with dizziness, left facial droop found to have 1.5 mm right internal capsule acute stroke, admitted to stroke unit followed NIH stroke scale, patient had MRI of the brain, MRI of the brain showed acute nonhemorrhagic infarct on the right mid to upper pons, MRA of the brain did not show any focal large vessel stenosis.  Acute ultrasound did not show any hemodynamically significant stenosis, echocardiogram showed EF 55 to 60% with normal wall motion.  Patient had impaired relaxation.  Physical  therapy saw the patient and patient did not require any physical therapy needs.  Today morning he feels much better and wants to go home.  Started on aspirin, patient LDL is 132 so started on statins as well.  Discussed this with patient.  Discussed all the lab findings with the patient.  Patient is stable for discharge. 2.  Seasonal allergies, patient can take Flonase, cetirizine.    Discharge Condition: Stable   Follow UP  Follow-up Information    Kandyce Rud, MD. Schedule an appointment as soon as possible for a visit in 1 week(s).   Specialty:  Family Medicine Contact information: [address] [facility] [place] [address] [phone]             Discharge Instructions  and  Discharge Medications      Allergies as of 04/13/2019      Reactions   Penicillins Rash   Did it involve swelling of the face/tongue/throat, SOB, or low BP? No Did it involve sudden or severe rash/hives, skin peeling, or any reaction on the inside of your mouth or nose? Yes Did you need to seek medical attention at a hospital or doctor's office? Unknown When did it last happen?"Years ago" If all above answers are "NO", may proceed with cephalosporin use.      Medication List    TAKE these medications   aspirin EC 81 MG tablet Take 1 tablet (81 mg total) by mouth daily.   atorvastatin 40 MG tablet Commonly known as:  Lipitor Take 1 tablet (40 mg total) by mouth daily.   cetirizine 10 MG tablet Commonly known as:  ZYRTEC Take 10 mg by  mouth daily.   fluticasone 50 MCG/ACT nasal spray Commonly known as:  FLONASE Place 1-2 sprays into both nostrils daily.         Diet and Activity recommendation: See Discharge Instructions above   Consults obtained -physical therapy, Occupational Therapy.   Major procedures and Radiology Reports - PLEASE review detailed and final reports for all details, in brief -     Mr Brain Wo  Contrast  Result Date: 04/12/2019 CLINICAL DATA:  LEFT-sided facial droop.  Dizziness. EXAM: MRI HEAD WITHOUT CONTRAST MRA HEAD WITHOUT CONTRAST TECHNIQUE: Multiplanar, multiecho pulse sequences of the brain and surrounding structures were obtained without intravenous contrast. Angiographic images of the head were obtained using MRA technique without contrast. COMPARISON:  CT head earlier today. FINDINGS: MRI HEAD FINDINGS Brain: Large area of restricted diffusion, RIGHT paramedian mid to upper pons, overlapping low ADC, consistent with acute infarction. The infarct is moderate-sized, 6 x 11 mm cross-section. No acute infarct is seen in the internal capsule as queried by CT. The observed brainstem infarct is not visible on earlier CT. There is no associated hemorrhage, and no areas of acute infarction elsewhere. No hemorrhage, mass lesion, hydrocephalus, or extra-axial fluid. Mild cerebral and cerebellar atrophy. Mild to moderate subcortical and periventricular T2 and FLAIR hyperintensities, likely chronic microvascular ischemic change. Vascular: Reported separately. Skull and upper cervical spine: Normal marrow signal. Sinuses/Orbits: Negative. Other: None. MRA HEAD FINDINGS The internal carotid arteries demonstrate wide patency throughout the cervical, petrous, cavernous, and supraclinoid internal carotid artery segments. The proximal anterior or middle cerebral arteries are widely patent including their major branches. The basilar artery is widely patent throughout its course, with vertebrals codominant. No evidence of stenosis or dissection. Cerebellar branches are poorly visualized except for BILATERAL PICA. Moderate disease is suspected of the RIGHT superior cerebellar and BILATERAL AICA cerebellar arteries. No visible saccular aneurysm. IMPRESSION: MRI brain demonstrates an acute nonhemorrhagic infarct of the RIGHT mid to upper pons. This is likely a small vessel insult. Elsewhere, mild atrophy with mild to  moderate small vessel disease. MR angiography intracranial circulation demonstrates no focal large vessel stenosis or basilar dissection. Electronically Signed   By: Elsie Stain M.D.   On: 04/12/2019 11:10   US Carotid Bilateral (at [facility] Only)  Result Date: 04/12/2019 CLINICAL DATA:  Acute infarct of the right pons. Dizziness, hypertension and hyperlipidemia. EXAM: BILATERAL CAROTID DUPLEX ULTRASOUND TECHNIQUE: Wallace Cullens scale imaging, color Doppler and duplex ultrasound were performed of bilateral carotid and vertebral arteries in the neck. COMPARISON:  None. FINDINGS: Criteria: Quantification of carotid stenosis is based on velocity parameters that correlate the residual internal carotid diameter with NASCET-based stenosis levels, using the diameter of the distal internal carotid lumen as the denominator for stenosis measurement. The following velocity measurements were obtained: RIGHT ICA:  92/31 cm/sec CCA:  62/15 cm/sec SYSTOLIC ICA/CCA RATIO:  1.5 ECA:  68 cm/sec LEFT ICA:  95/32 cm/sec CCA:  90/21 cm/sec SYSTOLIC ICA/CCA RATIO:  1.1 ECA:  73 cm/sec RIGHT CAROTID ARTERY: There is a minimal amount of calcified plaque at the level of the carotid bulb. No evidence of right ICA plaque or stenosis. RIGHT VERTEBRAL ARTERY: Antegrade flow with normal waveform and velocity. LEFT CAROTID ARTERY: The common carotid artery demonstrates intimal thickening. No focal plaque identified. No evidence of left-sided carotid stenosis. LEFT VERTEBRAL ARTERY: Antegrade flow with normal waveform and velocity. IMPRESSION: Minimal plaque at the level of the right carotid bulb. No evidence of bilateral ICA plaque or stenosis. Electronically Signed  By: Irish Lack M.D.   On: 04/12/2019 14:22   Mr Maxine Glenn Head/brain TI Cm  Result Date: 04/12/2019 CLINICAL DATA:  LEFT-sided facial droop.  Dizziness. EXAM: MRI HEAD WITHOUT CONTRAST MRA HEAD WITHOUT CONTRAST TECHNIQUE: Multiplanar, multiecho pulse sequences of the brain and  surrounding structures were obtained without intravenous contrast. Angiographic images of the head were obtained using MRA technique without contrast. COMPARISON:  CT head earlier today. FINDINGS: MRI HEAD FINDINGS Brain: Large area of restricted diffusion, RIGHT paramedian mid to upper pons, overlapping low ADC, consistent with acute infarction. The infarct is moderate-sized, 6 x 11 mm cross-section. No acute infarct is seen in the internal capsule as queried by CT. The observed brainstem infarct is not visible on earlier CT. There is no associated hemorrhage, and no areas of acute infarction elsewhere. No hemorrhage, mass lesion, hydrocephalus, or extra-axial fluid. Mild cerebral and cerebellar atrophy. Mild to moderate subcortical and periventricular T2 and FLAIR hyperintensities, likely chronic microvascular ischemic change. Vascular: Reported separately. Skull and upper cervical spine: Normal marrow signal. Sinuses/Orbits: Negative. Other: None. MRA HEAD FINDINGS The internal carotid arteries demonstrate wide patency throughout the cervical, petrous, cavernous, and supraclinoid internal carotid artery segments. The proximal anterior or middle cerebral arteries are widely patent including their major branches. The basilar artery is widely patent throughout its course, with vertebrals codominant. No evidence of stenosis or dissection. Cerebellar branches are poorly visualized except for BILATERAL PICA. Moderate disease is suspected of the RIGHT superior cerebellar and BILATERAL AICA cerebellar arteries. No visible saccular aneurysm. IMPRESSION: MRI brain demonstrates an acute nonhemorrhagic infarct of the RIGHT mid to upper pons. This is likely a small vessel insult. Elsewhere, mild atrophy with mild to moderate small vessel disease. MR angiography intracranial circulation demonstrates no focal large vessel stenosis or basilar dissection. Electronically Signed   By: Elsie Stain M.D.   On: 04/12/2019 11:10    Ct Head Code Stroke Wo Contrast  Result Date: 04/12/2019 CLINICAL DATA:  Code stroke.  LEFT-sided facial droop. Dizziness. EXAM: CT HEAD WITHOUT CONTRAST TECHNIQUE: Contiguous axial images were obtained from the base of the skull through the vertex without intravenous contrast. COMPARISON:  None. FINDINGS: Brain: 5 mm area of hypodensity, in the region of the posterior limb internal capsule on the RIGHT, could represent acute infarct. Elsewhere, no definite acute cortical hypodensity, mass lesion, hemorrhage, hydrocephalus, or extra-axial fluid. Vascular: Calcification of the cavernous internal carotid arteries consistent with cerebrovascular atherosclerotic disease. No signs of intracranial large vessel occlusion. Skull: Intact. Sinuses/Orbits: No acute finding. Other: None. ASPECTS (Alberta Stroke Program Early CT Score) - Ganglionic level infarction (caudate, lentiform nuclei, internal capsule, insula, M1-M3 cortex): 6 - Supraganglionic infarction (M4-M6 cortex): 3 Total score (0-10 with 10 being normal): 9 IMPRESSION: 1. 5 mm area of hypodensity in the posterior limb internal capsule on the RIGHT could represent acute infarction. 2. ASPECTS is 9. 3. These results were called by telephone at the time of interpretation on 04/12/2019 at 9:14 am to Dr. Nita Sickle , who verbally acknowledged these results. * Electronically Signed   By: Elsie Stain M.D.   On: 04/12/2019 09:15    Micro Results   No results found for this or any previous visit (from the past 240 hour(s)).     Today   Subjective:   William Bennett today has no headache,no chest abdominal pain,no new weakness tingling or numbness, feels much better wants to go home today.   Objective:   Blood pressure 133/74, pulse (!) 58, temperature  98.3 F (36.8 C), temperature source Oral, resp. rate 17, height 5\' 9"  (1.753 m), weight 81.4 kg, SpO2 94 %.   Intake/Output Summary (Last 24 hours) at 04/13/2019 1112 Last data filed at  04/13/2019 0950 Gross per 24 hour  Intake 480 ml  Output -  Net 480 ml    Exam Awake Alert, Oriented x 3, No new F.N deficits, Normal affect [place].AT,PERRAL Supple Neck,No JVD, No cervical lymphadenopathy appriciated.  Symmetrical Chest wall movement, Good air movement bilaterally, CTAB RRR,No Gallops,Rubs or new Murmurs, No Parasternal Heave +ve B.Sounds, Abd Soft, Non tender, No organomegaly appriciated, No rebound -guarding or rigidity. No Cyanosis, Clubbing or edema, No new Rash or bruise  Data Review   CBC w Diff:  Lab Results  Component Value Date   WBC 4.4 04/12/2019   HGB 14.3 04/12/2019   HCT 42.1 04/12/2019   PLT 174 04/12/2019   LYMPHOPCT 20 04/12/2019   MONOPCT 8 04/12/2019   EOSPCT 5 04/12/2019   BASOPCT 1 04/12/2019    CMP:  Lab Results  Component Value Date   NA 138 04/12/2019   K 3.9 04/12/2019   CL 107 04/12/2019   CO2 25 04/12/2019   BUN 22 04/12/2019   CREATININE 1.14 04/12/2019   PROT 7.3 04/12/2019   ALBUMIN 4.6 04/12/2019   BILITOT 1.1 04/12/2019   ALKPHOS 56 04/12/2019   AST 22 04/12/2019   ALT 21 04/12/2019  .   Total Time in preparing paper work, data evaluation and todays exam - 35 minutes  Katha HammingSnehalatha Quy Lotts M.D on 04/13/2019 at 11:12 AM    Note: This dictation was prepared with Dragon dictation along with smaller phrase technology. Any transcriptional errors that result from this process are unintentional.

## 2019-04-13 NOTE — Progress Notes (Signed)
*  PRELIMINARY RESULTS* Echocardiogram 2D Echocardiogram has been performed.  William Bennett 04/13/2019, 9:05 AM

## 2019-04-13 NOTE — Progress Notes (Signed)
Discharge instructions given to pt. IV removed. Pt dressed and will be discharged home with wife

## 2019-04-14 LAB — HIV ANTIBODY (ROUTINE TESTING W REFLEX): HIV Screen 4th Generation wRfx: NONREACTIVE

## 2019-04-20 DIAGNOSIS — I69354 Hemiplegia and hemiparesis following cerebral infarction affecting left non-dominant side: Secondary | ICD-10-CM | POA: Insufficient documentation

## 2020-05-16 ENCOUNTER — Encounter: Payer: Self-pay | Admitting: Emergency Medicine

## 2020-05-16 ENCOUNTER — Emergency Department
Admission: EM | Admit: 2020-05-16 | Discharge: 2020-05-16 | Disposition: A | Discharge status: Home / Self Care | Payer: Medicare Other | Attending: Emergency Medicine | Admitting: Emergency Medicine

## 2020-05-16 ENCOUNTER — Other Ambulatory Visit: Payer: Self-pay

## 2020-05-16 DIAGNOSIS — Z79899 Other long term (current) drug therapy: Secondary | ICD-10-CM | POA: Diagnosis not present

## 2020-05-16 DIAGNOSIS — R339 Retention of urine, unspecified: Secondary | ICD-10-CM

## 2020-05-16 DIAGNOSIS — Z8673 Personal history of transient ischemic attack (TIA), and cerebral infarction without residual deficits: Secondary | ICD-10-CM | POA: Diagnosis not present

## 2020-05-16 DIAGNOSIS — K5900 Constipation, unspecified: Secondary | ICD-10-CM | POA: Diagnosis present

## 2020-05-16 LAB — COMPREHENSIVE METABOLIC PANEL
ALT: 31 U/L (ref 0–44)
AST: 33 U/L (ref 15–41)
Albumin: 4.8 g/dL (ref 3.5–5.0)
Alkaline Phosphatase: 67 U/L (ref 38–126)
Anion gap: 12 (ref 5–15)
BUN: 30 mg/dL — ABNORMAL HIGH (ref 8–23)
CO2: 22 mmol/L (ref 22–32)
Calcium: 11.2 mg/dL — ABNORMAL HIGH (ref 8.9–10.3)
Chloride: 105 mmol/L (ref 98–111)
Creatinine, Ser: 1.91 mg/dL — ABNORMAL HIGH (ref 0.61–1.24)
GFR calc Af Amer: 40 mL/min — ABNORMAL LOW (ref >60)
GFR calc non Af Amer: 34 mL/min — ABNORMAL LOW (ref >60)
Glucose, Bld: 182 mg/dL — ABNORMAL HIGH (ref 70–99)
Potassium: 3.9 mmol/L (ref 3.5–5.1)
Sodium: 139 mmol/L (ref 135–145)
Total Bilirubin: 1.7 mg/dL — ABNORMAL HIGH (ref 0.3–1.2)
Total Protein: 7.8 g/dL (ref 6.5–8.1)

## 2020-05-16 LAB — URINALYSIS, COMPLETE (UACMP) WITH MICROSCOPIC
Bacteria, UA: NONE SEEN
Bilirubin Urine: NEGATIVE
Glucose, UA: NEGATIVE mg/dL
Ketones, ur: NEGATIVE mg/dL
Leukocytes,Ua: NEGATIVE
Nitrite: NEGATIVE
Protein, ur: NEGATIVE mg/dL
Specific Gravity, Urine: 1.006 (ref 1.005–1.030)
Squamous Epithelial / HPF: NONE SEEN (ref 0–5)
pH: 5 (ref 5.0–8.0)

## 2020-05-16 LAB — CBC
HCT: 39.7 % (ref 39.0–52.0)
Hemoglobin: 14.4 g/dL (ref 13.0–17.0)
MCH: 30.5 pg (ref 26.0–34.0)
MCHC: 36.3 g/dL — ABNORMAL HIGH (ref 30.0–36.0)
MCV: 84.1 fL (ref 80.0–100.0)
Platelets: 204 10*3/uL (ref 150–400)
RBC: 4.72 MIL/uL (ref 4.22–5.81)
RDW: 12.4 % (ref 11.5–15.5)
WBC: 11 10*3/uL — ABNORMAL HIGH (ref 4.0–10.5)
nRBC: 0 % (ref 0.0–0.2)

## 2020-05-16 LAB — LIPASE, BLOOD: Lipase: 30 U/L (ref 11–51)

## 2020-05-16 MED ORDER — LACTATED RINGERS IV BOLUS
1000.0000 mL | Freq: Once | INTRAVENOUS | Status: AC
  Administered 2020-05-16: 1000 mL via INTRAVENOUS

## 2020-05-16 MED ORDER — SODIUM CHLORIDE 0.9% FLUSH: 3.0000 mL | Freq: Once | INTRAVENOUS | Status: DC

## 2020-05-16 NOTE — ED Notes (Signed)
Pt switched to a leg bag and educated about it. Wife at bedside. Pt urinated another 200cc.

## 2020-05-16 NOTE — ED Notes (Signed)
Pt reports new issue in which he had nausea, abd discomfort from no recent BMs, and not being able to urinate. States was getting ready for his colonoscopy which is one or two days from now when all his started up. Pt reports relief of nausea and abd discomfort since having the foley cath placed.

## 2020-05-16 NOTE — ED Notes (Signed)
Pt had soft BM.

## 2020-05-16 NOTE — ED Provider Notes (Signed)
[facility] Emergency Department Provider Note   ____________________________________________   First MD Initiated Contact with Patient 05/16/20 1328     (approximate)  I have reviewed the triage vital signs and the nursing notes.   HISTORY  Chief Complaint Fecal Impaction    HPI William Bennett is a 74 y.o. male with past medical history of stroke who presents to the ED complaining of constipation.  Patient reports that he has been attempting to prep for a colonoscopy but despite taking the medications he has not had any bowel movements.  He has felt like something is stuck at his bottom but is also having difficulty urinating and feels like his lower abdomen was rockhard.  He had one episode of nausea with vomiting when he attempted to drink over-the-counter constipation medicine.  He reports some issues with straining to urinate in the past, but has not had any recent dysuria or hematuria.  He denies any fevers or flank pain.  Bladder scan from triage was remarkable for greater than 1000 cc of urine and Foley catheter subsequently placed.  Patient now reports his discomfort is much improved and he does not feel like he has any pressure at his bottom anymore.        History reviewed. No pertinent past medical history.  Patient Active Problem List   Diagnosis Date Noted  . Acute ischemic right MCA stroke (HCC) 04/12/2019    Past Surgical History:  Procedure Laterality Date  . thumb surgery      Prior to Admission medications   Medication Sig Start Date End Date Taking? Authorizing Provider  atorvastatin (LIPITOR) 40 MG tablet Take 1 tablet (40 mg total) by mouth daily. 04/13/19 06/12/19  Katha Hamming, MD  cetirizine (ZYRTEC) 10 MG tablet Take 10 mg by mouth daily.    [provider]  fluticasone (FLONASE) 50 MCG/ACT nasal spray Place 1-2 sprays into both nostrils daily.    [provider]    Allergies Penicillins  No  family history on file.  Social History Social History   Tobacco Use  . Smoking status: Never Smoker  . Smokeless tobacco: Never Used  Substance Use Topics  . Alcohol use: Not Currently  . Drug use: Not Currently    Review of Systems  Constitutional: No fever/chills Eyes: No visual changes. ENT: No sore throat. Cardiovascular: Denies chest pain. Respiratory: Denies shortness of breath. Gastrointestinal: Positive for abdominal pain.  No nausea, no vomiting.  No diarrhea.  Positive for constipation. Genitourinary: Negative for dysuria.  Positive for urinary retention. Musculoskeletal: Negative for back pain. Skin: Negative for rash. Neurological: Negative for headaches, focal weakness or numbness.  ____________________________________________   PHYSICAL EXAM:  VITAL SIGNS: ED Triage Vitals  Enc Vitals Group     BP 05/16/20 1251 (!) 178/74     Pulse Rate 05/16/20 1251 (!) 59     Resp 05/16/20 1251 (!) 22     Temp 05/16/20 1251 98.5 F (36.9 C)     Temp Source 05/16/20 1251 Oral     SpO2 05/16/20 1251 100 %     Weight 05/16/20 1252 185 lb (83.9 kg)     Height 05/16/20 1252 5\' 8"  (1.727 m)     Head Circumference --      Peak Flow --      Pain Score 05/16/20 1252 7     Pain Loc --      Pain Edu? --      Excl. in GC? --  Constitutional: Alert and oriented. Eyes: Conjunctivae are normal. Head: Atraumatic. Nose: No congestion/rhinnorhea. Mouth/Throat: Mucous membranes are moist. Neck: Normal ROM Cardiovascular: Normal rate, regular rhythm. Grossly normal heart sounds. Respiratory: Normal respiratory effort.  No retractions. Lungs CTAB. Gastrointestinal: Soft and nontender. No distention.  No fecal impaction noted on rectal exam. Genitourinary: Foley catheter in place draining clear yellow urine. Musculoskeletal: No lower extremity tenderness nor edema. Neurologic:  Normal speech and language. No gross focal neurologic deficits are appreciated. Skin:  Skin is  warm, dry and intact. No rash noted. Psychiatric: Mood and affect are normal. Speech and behavior are normal.  ____________________________________________   LABS (all labs ordered are listed, but only abnormal results are displayed)  Labs Reviewed  COMPREHENSIVE METABOLIC PANEL - Abnormal; Notable for the following components:      Result Value   Glucose, Bld 182 (*)    BUN 30 (*)    Creatinine, Ser 1.91 (*)    Calcium 11.2 (*)    Total Bilirubin 1.7 (*)    GFR calc non Af Amer 34 (*)    GFR calc Af Amer 40 (*)    All other components within normal limits  CBC - Abnormal; Notable for the following components:   WBC 11.0 (*)    MCHC 36.3 (*)    All other components within normal limits  URINALYSIS, COMPLETE (UACMP) WITH MICROSCOPIC - Abnormal; Notable for the following components:   Color, Urine YELLOW (*)    APPearance CLEAR (*)    Hgb urine dipstick MODERATE (*)    All other components within normal limits  LIPASE, BLOOD   ____________________________________________  EKG  ED ECG REPORT I, Blake Divine, the attending physician, personally viewed and interpreted this ECG.   Date: 05/16/2020  EKG Time: 12:57  Rate: 68  Rhythm: Sinus arrhythmia  Axis: Normal  Intervals:Incomplete RBBB  ST&T Change: None   PROCEDURES  Procedure(s) performed (including Critical Care):  Procedures   ____________________________________________   INITIAL IMPRESSION / ASSESSMENT AND PLAN / ED COURSE       74 year old male with past medical history of stroke presents to the ED complaining of rectal discomfort, lower abdominal distention, and difficulty urinating since yesterday evening.  He was found to have bladder scan greater than 1000 cc from triage and Foley catheter subsequently placed.  He now reports significant improvement in both his rectal and abdominal discomfort, now has no abdominal distention or tenderness on exam.  His rectal exam also shows no evidence of fecal  impaction.  UA shows no evidence of infection, labs are remarkable for AKI, will hydrate with IV fluids and discuss with urology.  Case discussed with Dr. Junious Silk of urology, who agrees with plan for outpatient follow-up following hydration with IV fluids.  Patient counseled to keep up oral intake of fluids and return to the ED for new or worsening symptoms.  He was counseled to follow-up with urology and agrees with plan.      ____________________________________________   FINAL CLINICAL IMPRESSION(S) / ED DIAGNOSES  Final diagnoses:  Urinary retention  Constipation, unspecified constipation type     ED Discharge Orders    None       Note:  This document was prepared using Dragon voice recognition software and may include unintentional dictation errors.   Blake Divine, MD 05/16/20 1432

## 2020-05-16 NOTE — ED Notes (Signed)
EDP Jessup at bedside. Pt's wife at bedside. This RN remains at bedside while EDP Jessup completes assessment.

## 2020-05-16 NOTE — ED Triage Notes (Signed)
Pt states was at home prepping for colonscopy, pt states ate ground beef for lunch yesterday. Pt states drank OTC bottle of liquid of constipation and took a pill for constipation. Pt reports feeling like he needs to have a bowel movement. Pt reports "I've been pushing but nothing is coming". Pt reports he is unable to urinate at this time.   Pt reports threw up "about half of the liquid" he drank for constipation.

## 2020-05-25 ENCOUNTER — Ambulatory Visit: Payer: Medicare Other | Admitting: Urology

## 2020-05-25 ENCOUNTER — Other Ambulatory Visit: Payer: Self-pay

## 2020-05-25 ENCOUNTER — Ambulatory Visit (INDEPENDENT_AMBULATORY_CARE_PROVIDER_SITE_OTHER): Payer: Medicare Other | Admitting: Urology

## 2020-05-25 ENCOUNTER — Encounter: Payer: Self-pay | Admitting: Urology

## 2020-05-25 VITALS — BP 157/80 | HR 73 | Ht 69.0 in | Wt 188.8 lb

## 2020-05-25 DIAGNOSIS — Z466 Encounter for fitting and adjustment of urinary device: Secondary | ICD-10-CM

## 2020-05-25 DIAGNOSIS — N179 Acute kidney failure, unspecified: Secondary | ICD-10-CM

## 2020-05-25 DIAGNOSIS — R339 Retention of urine, unspecified: Secondary | ICD-10-CM

## 2020-05-25 MED ORDER — SULFAMETHOXAZOLE-TRIMETHOPRIM 800-160 MG PO TABS
1.0000 | ORAL_TABLET | Freq: Once | ORAL | Status: AC
  Administered 2020-05-25: 1 via ORAL

## 2020-05-25 MED ORDER — TAMSULOSIN HCL 0.4 MG PO CAPS: 0.4000 mg | ORAL_CAPSULE | Freq: Every day | ORAL | 11 refills | Status: DC

## 2020-05-25 NOTE — Progress Notes (Signed)
Fill and Pull Catheter Removal  Patient is present today for a catheter removal.  Patient was cleaned and prepped in a sterile fashion of sterile water/ saline was instilled into the bladder when the patient felt the urge to urinate. 21ml of water was then drained from the balloon.  A 16FR foley cath was removed from the bladder no complications were noted. Patient tolerated well. Patient as then given some time to void on their own. Patient was not able to void after some time, per provider patient was encouraged to go home, push fluids, and try to void often. Patient voiced understanding.   Performed by: Debbe Bales, CMA   Follow up/ Additional notes: RTC this afternoon for PVR    Simple Catheter Placement  Due to urinary retention patient is present today for a foley cath placement.  Patient was cleaned and prepped in a sterile fashion with betadine. A 16FR foley catheter was inserted, urine return was noted  , urine was pale yellow in color. The balloon was filled with 10cc of sterile water.  A leg bag was attached for drainage. Patient declined night bag. Patient was given instruction on proper catheter care. Patient tolerated well, no complications were noted. Pt will have renal u/s, provider to call with results then schedule HoLEP.   Performed by: Debbe Bales, CMA   Additional notes/ Follow up: See above.

## 2020-05-25 NOTE — Patient Instructions (Signed)
Benign Prostatic Hyperplasia  Benign prostatic hyperplasia (BPH) is an enlarged prostate gland that is caused by the normal aging process and not by cancer. The prostate is a walnut-sized gland that is involved in the production of semen. It is located in front of the rectum and below the bladder. The bladder stores urine and the urethra is the tube that carries the urine out of the body. The prostate may get bigger as a man gets older. An enlarged prostate can press on the urethra. This can make it harder to pass urine. The build-up of urine in the bladder can cause infection. Back pressure and infection may progress to bladder damage and kidney (renal) failure. What are the causes? This condition is part of a normal aging process. However, not all men develop problems from this condition. If the prostate enlarges away from the urethra, urine flow will not be blocked. If it enlarges toward the urethra and compresses it, there will be problems passing urine. What increases the risk? This condition is more likely to develop in men over the age of 50 years. What are the signs or symptoms? Symptoms of this condition include:  Getting up often during the night to urinate.  Needing to urinate frequently during the day.  Difficulty starting urine flow.  Decrease in size and strength of your urine stream.  Leaking (dribbling) after urinating.  Inability to pass urine. This needs immediate treatment.  Inability to completely empty your bladder.  Pain when you pass urine. This is more common if there is also an infection.  Urinary tract infection (UTI). How is this diagnosed? This condition is diagnosed based on your medical history, a physical exam, and your symptoms. Tests will also be done, such as:  A post-void bladder scan. This measures any amount of urine that may remain in your bladder after you finish urinating.  A digital rectal exam. In a rectal exam, your health care provider  checks your prostate by putting a lubricated, gloved finger into your rectum to feel the back of your prostate gland. This exam detects the size of your gland and any abnormal lumps or growths.  An exam of your urine (urinalysis).  A prostate specific antigen (PSA) screening. This is a blood test used to screen for prostate cancer.  An ultrasound. This test uses sound waves to electronically produce a picture of your prostate gland. Your health care provider may refer you to a specialist in kidney and prostate diseases (urologist). How is this treated? Once symptoms begin, your health care provider will monitor your condition (active surveillance or watchful waiting). Treatment for this condition will depend on the severity of your condition. Treatment may include:  Observation and yearly exams. This may be the only treatment needed if your condition and symptoms are mild.  Medicines to relieve your symptoms, including: ? Medicines to shrink the prostate. ? Medicines to relax the muscle of the prostate.  Surgery in severe cases. Surgery may include: ? Prostatectomy. In this procedure, the prostate tissue is removed completely through an open incision or with a laparoscope or robotics. ? Transurethral resection of the prostate (TURP). In this procedure, a tool is inserted through the opening at the tip of the penis (urethra). It is used to cut away tissue of the inner core of the prostate. The pieces are removed through the same opening of the penis. This removes the blockage. ? Transurethral incision (TUIP). In this procedure, small cuts are made in the prostate. This lessens   the prostate's pressure on the urethra. ? Transurethral microwave thermotherapy (TUMT). This procedure uses microwaves to create heat. The heat destroys and removes a small amount of prostate tissue. ? Transurethral needle ablation (TUNA). This procedure uses radio frequencies to destroy and remove a small amount of  prostate tissue. ? Interstitial laser coagulation (ILC). This procedure uses a laser to destroy and remove a small amount of prostate tissue. ? Transurethral electrovaporization (TUVP). This procedure uses electrodes to destroy and remove a small amount of prostate tissue. ? Prostatic urethral lift. This procedure inserts an implant to push the lobes of the prostate away from the urethra. Follow these instructions at home:  Take over-the-counter and prescription medicines only as told by your health care provider.  Monitor your symptoms for any changes. Contact your health care provider with any changes.  Avoid drinking large amounts of liquid before going to bed or out in public.  Avoid or reduce how much caffeine or alcohol you drink.  Give yourself time when you urinate.  Keep all follow-up visits as told by your health care provider. This is important. Contact a health care provider if:  You have unexplained back pain.  Your symptoms do not get better with treatment.  You develop side effects from the medicine you are taking.  Your urine becomes very dark or has a bad smell.  Your lower abdomen becomes distended and you have trouble passing your urine. Get help right away if:  You have a fever or chills.  You suddenly cannot urinate.  You feel lightheaded, or very dizzy, or you faint.  There are large amounts of blood or clots in the urine.  Your urinary problems become hard to manage.  You develop moderate to severe low back or flank pain. The flank is the side of your body between the ribs and the hip. These symptoms may represent a serious problem that is an emergency. Do not wait to see if the symptoms will go away. Get medical help right away. Call your local emergency services (911 in the [place] Do not drive yourself to the hospital. Summary  Benign prostatic hyperplasia (BPH) is an enlarged prostate that is caused by the normal aging process and not by  cancer.  An enlarged prostate can press on the urethra. This can make it hard to pass urine.  This condition is part of a normal aging process and is more likely to develop in men over the age of 50 years.  Get help right away if you suddenly cannot urinate. This information is not intended to replace advice given to you by your health care provider. Make sure you discuss any questions you have with your health care provider. Document Revised: 10/28/2018 Document Reviewed: 01/07/2017 Elsevier Patient Education  2020 [employer]   Holmium Laser Enucleation of the Prostate (HoLEP)  HoLEP is a treatment for men with benign prostatic hyperplasia (BPH). The laser surgery removed blockages of urine flow, and is done without any incisions on the body.     What is HoLEP?  HoLEP is a type of laser surgery used to treat obstruction (blockage) of urine flow as a result of benign prostatic hyperplasia (BPH). In men with BPH, the prostate gland is not cancerous, but has become enlarged. An enlarged prostate can result in a number of urinary tract symptoms such as weak urinary stream, difficulty in starting urination, inability to urinate, frequent urination, or getting up at night to urinate.  HoLEP was developed in the 1990's   as a more effective and less expensive surgical option for BPH, compared to other surgical options such as laser vaporization(PVP/greenlight laser), transurethral resection of the prostate(TURP), and open simple prostatectomy.   What happens during a HoLEP?  HoLEP requires general anesthesia ("asleep" throughout the procedure).   An antibiotic is given to reduce the risk of infection  A surgical instrument called a resectoscope is inserted through the urethra (the tube that carries urine from the bladder). The resectoscope has a camera that allows the surgeon to view the internal structure of the prostate gland, and to see where the incisions are being made during  surgery.  The laser is inserted into the resectoscope and is used to enucleate (free up) the enlarged prostate tissue from the capsule (outer shell) and then to seal up any blood vessels. The tissue that has been removed is pushed back into the bladder.  A morcellator is placed through the resectoscope, and is used to suction out the prostate tissue that has been pushed into the bladder.  When the prostate tissue has been removed, the resectoscope is removed, and a foley catheter is placed to allow healing and drain the urine from the bladder.     What happens after a HoLEP?  More than 90% of patients go home the same day a few hours after surgery. Less than 10% will be admitted to the hospital overnight for observation to monitor the urine, or if they have other medical problems.  Fluid is flushed through the catheter for about 1 hour after surgery to clear any blood from the urine. It is normal to have some blood in the urine after surgery. The need for blood transfusion is extremely rare.  Eating and drinking are permitted after the procedure once the patient has fully awakened from anesthesia.  The catheter is usually removed 2-3 days after surgery- the patient will come to clinic to have the catheter removed and make sure they can urinate on their own.  It is very important to drink lots of fluids after surgery for one week to keep the bladder flushed.  At first, there may be some burning with urination, but this typically improved within a few hours to days. Most patients do not have a significant amount of pain, and narcotic pain medications are rarely needed.  Symptoms of urinary frequency, urgency, and even leakage are NORMAL for the first few weeks after surgery as the bladder adjusts after having to work hard against blockage from the prostate for many years. This will improve, but can sometimes take several months.  The use of pelvic floor exercises (Kegel exercises) can help  improve problems with urinary incontinence.   After catheter removal, patients will be seen at 6 weeks and 6 months for symptom check  No heavy lifting for at least 2-3 weeks after surgery, however patients can walk and do light activities the first day after surgery. Return to work time depends on occupation.    What are the advantages of HoLEP?  HoLEP has been studied in many different parts of the world and has been shown to be a safe and effective procedure. Although there are many types of BPH surgeries available, HoLEP offers a unique advantage in being able to remove a large amount of tissue without any incisions on the body, even in very large prostates, while decreasing the risk of bleeding and providing tissue for pathology (to look for cancer). This decreases the need for blood transfusions during surgery, minimizes hospital stay,   and reduces the risk of needing repeat treatment.  What are the side effects of HoLEP?  Temporary burning and bleeding during urination. Some blood may be seen in the urine for weeks after surgery and is part of the healing process.  Urinary incontinence (inability to control urine flow) is expected in all patients immediately after surgery and they should wear pads for the first few days/weeks. This typically improves over the course of several weeks. Performing Kegel exercises can help decrease leakage from stress maneuvers such as coughing, sneezing, or lifting. The rate of long term leakage is very low. Patients may also have leakage with urgency and this may be treated with medication. The risk of urge incontinence can be dependent on several factors including age, prostate size, symptoms, and other medical problems.  Retrograde ejaculation or "backwards ejaculation." In 75% of cases, the patient will not see any fluid during ejaculation after surgery.  Erectile function is generally not significantly affected.   What are the risks of HoLEP?  Injury  to the urethra or development of scar tissue at a later date  Injury to the capsule of the prostate (typically treated with longer catheterization).  Injury to the bladder or ureteral orifices (where the urine from the kidney drains out)  Infection of the bladder, testes, or kidneys  Return of urinary obstruction at a later date requiring another operation (<2%)  Need for blood transfusion or re-operation due to bleeding  Failure to relieve all symptoms and/or need for prolonged catheterization after surgery  5-15% of patients are found to have previously undiagnosed prostate cancer in their specimen. Prostate cancer can be treated after HoLEP.  Standard risks of anesthesia including blood clots, heart attacks, etc  When should I call my doctor?  Fever over 101.3 degrees  Inability to urinate, or large blood clots in the urine   

## 2020-05-25 NOTE — Progress Notes (Signed)
05/25/20 8:43 AM   William Bennett [birth year] [id number]  CC: Urinary retention  HPI: I saw William Bennett today in urology clinic today for evaluation of urinary retention.  He is a relatively healthy 74 year old male with a history of stroke about 1 year ago with no residual deficits who presented to the ED on 05/16/2020 with lower abdominal pressure and pain was found to have a bladder scan of over 1 L.  A Foley catheter was placed with return of clear yellow urine and resolution of his pain.  No imaging was performed.  Urinalysis was benign, and creatinine was elevated at 1.91 from baseline of 1.14.  His urologic history is notable for 2 negative prostate biopsies at [facility] in 2011.  Prostate volume measured 45 g at that time.  Most recent PSA in December 2020 was within the normal range at 2.35.  Prior to having the catheter placed, he reports a few months of weak stream as well as some urge incontinence.  The symptoms were present prior to his stroke, but he thinks his symptoms have worsened over the last 3 to 6 months.  He denies any history of UTI or prior history of urinary retention.  He denies any family history of prostate cancer or gross hematuria.   Surgical History: Past Surgical History:  Procedure Laterality Date   thumb surgery      Family History: No family history on file.  Social History:  reports that he has never smoked. He has never used smokeless tobacco. He reports previous alcohol use. He reports previous drug use.  Physical Exam: BP (!) 157/80 (BP Location: Left Arm, Patient Position: Sitting, Cuff Size: Normal)    Pulse 73    Ht 5\' 9"  (1.753 m)    Wt 188 lb 12.8 oz (85.6 kg)    BMI 27.88 kg/m    Constitutional:  Alert and oriented, No acute distress. Cardiovascular: No clubbing, cyanosis, or edema. Respiratory: Normal respiratory effort, no increased work of breathing. GI: Abdomen is soft, nontender, nondistended, no abdominal masses GU: Circumcised phallus  with 16 French Foley catheter with clear yellow urine DRE: 60 g prostate, benign without nodules or masses  Laboratory Data: Reviewed, see HPI  Pertinent Imaging: None to review  Assessment & Plan:   In summary, is a 74 year old male with urinary retention on 05/16/2020 who presents for a void trial today after developing urinary retention last week with a bladder scan over 1 L.  A Foley was uneventfully placed in the ED.  His Foley was removed this morning, and he returned to clinic early in the afternoon with inability to void and severe lower abdominal pressure and pain and bladder scan over 1 L.  A 16 French Foley catheter was placed uneventfully and connected to drainage.  We discussed options at length including chronic Foley catheter, intermittent catheterization, trial of medical management with Flomax, urodynamics in [facility] to evaluate bladder function versus prostate obstruction, or definitive management with outlet procedure.  He would like to pursue outlet procedure for his persistently bothersome urinary symptoms and refractory urinary retention.  We discussed the risks and benefits of HoLEP at length.  The procedure requires general anesthesia and takes 2 to 3 hours, and a holmium laser is used to enucleate the prostate and push this tissue into the bladder.  A morcellator is then used to remove this tissue, which is sent for pathology.  The vast majority of patients are able to discharge the same day with a catheter  in place for 2 to 3 days, and will follow-up in clinic for a voiding trial.  Approximately 5% of patients will be admitted overnight to monitor the urine, or if they have multiple co-morbidities.  We specifically discussed the risks of bleeding, infection, retrograde ejaculation, temporary urgency and urge incontinence, very low risk of long-term incontinence, pathologic evaluation of prostate tissue and possible detection of prostate cancer or other malignancy, and  possible need for additional procedures.  Schedule HoLEP Schedule renal ultrasound to evaluate for hydronephrosis or other pathology, as well as measure prostate volume  Legrand Rams, MD 05/25/2020  [facility] [address], [address] [place], [place] [address] [phone]

## 2020-05-26 ENCOUNTER — Other Ambulatory Visit: Payer: Self-pay | Admitting: Radiology

## 2020-05-26 DIAGNOSIS — R338 Other retention of urine: Secondary | ICD-10-CM

## 2020-05-26 DIAGNOSIS — R339 Retention of urine, unspecified: Secondary | ICD-10-CM

## 2020-05-26 LAB — BASIC METABOLIC PANEL
BUN/Creatinine Ratio: 19 (ref 10–24)
BUN: 20 mg/dL (ref 8–27)
CO2: 20 mmol/L (ref 20–29)
Calcium: 9.8 mg/dL (ref 8.6–10.2)
Chloride: 105 mmol/L (ref 96–106)
Creatinine, Ser: 1.04 mg/dL (ref 0.76–1.27)
GFR calc Af Amer: 83 mL/min/{1.73_m2} (ref >59)
GFR calc non Af Amer: 72 mL/min/{1.73_m2} (ref >59)
Glucose: 89 mg/dL (ref 65–99)
Potassium: 4.2 mmol/L (ref 3.5–5.2)
Sodium: 141 mmol/L (ref 134–144)

## 2020-06-02 ENCOUNTER — Other Ambulatory Visit: Payer: Self-pay

## 2020-06-02 DIAGNOSIS — N401 Enlarged prostate with lower urinary tract symptoms: Secondary | ICD-10-CM

## 2020-06-03 ENCOUNTER — Other Ambulatory Visit: Payer: Self-pay

## 2020-06-03 ENCOUNTER — Encounter
Admission: RE | Admit: 2020-06-03 | Discharge: 2020-06-03 | Disposition: A | Discharge status: Home / Self Care | Payer: Medicare Other | Source: Ambulatory Visit | Attending: Urology | Admitting: Urology

## 2020-06-03 ENCOUNTER — Ambulatory Visit: Payer: Medicare Other | Admitting: *Deleted

## 2020-06-03 DIAGNOSIS — R338 Other retention of urine: Secondary | ICD-10-CM

## 2020-06-03 DIAGNOSIS — N401 Enlarged prostate with lower urinary tract symptoms: Secondary | ICD-10-CM

## 2020-06-03 HISTORY — DX: Cardiac murmur, unspecified: R01.1

## 2020-06-03 HISTORY — DX: Benign prostatic hyperplasia without lower urinary tract symptoms: N40.0

## 2020-06-03 HISTORY — DX: Cerebral infarction, unspecified: I63.9

## 2020-06-03 NOTE — Progress Notes (Signed)
Patient presents to clinic for Cath UA sample. Patient catheter plugged, waited 10 minutes. Collected urine sample.

## 2020-06-03 NOTE — Patient Instructions (Signed)
Your procedure is scheduled on: Friday 6/25 Report to Day Surgery. To find out your arrival time please call [phone] between 1PM - 3PM on Thurs 6/24  Remember: Instructions that are not followed completely may result in serious medical risk,  up to and including death, or upon the discretion of your surgeon and anesthesiologist your  surgery may need to be rescheduled.     _X__ 1. Do not eat food after midnight the night before your procedure.                 No gum chewing or hard candies. You may drink clear liquids up to 2 hours                 before you are scheduled to arrive for your surgery- DO not drink clear                 liquids within 2 hours of the start of your surgery.                 Clear Liquids include:  water, apple juice without pulp, clear Gatorade, G2 or                  Gatorade Zero (avoid Red/Purple/Blue), Black Coffee or Tea (Do not add                 anything to coffee or tea). _____2.   Complete the carbohydrate drink provided to you, 2 hours before arrival.  __X__2.  On the morning of surgery brush your teeth with toothpaste and water, you                may rinse your mouth with mouthwash if you wish.  Do not swallow any toothpaste of mouthwash.     ___ 3.  No Alcohol for 24 hours before or after surgery.   ___ 4.  Do Not Smoke or use e-cigarettes For 24 Hours Prior to Your Surgery.                 Do not use any chewable tobacco products for at least 6 hours prior to                 Surgery.  ___  5.  Do not use any recreational drugs (marijuana, cocaine, heroin, ecstacy, MDMA or other)                For at least one week prior to your surgery.  Combination of these drugs with anesthesia                May have life threatening results.  ____  6.  Bring all medications with you on the day of surgery if instructed.   _x___  7.  Notify your doctor if there is any change in your medical condition      (cold, fever,  infections).     Do not wear jewelry, Do not wear lotions, powders,  You may wear deodorant. Do not shave 48 hours prior to surgery. Men may shave face and neck. Do not bring valuables to the hospital.    [facility] is not responsible for any belongings or valuables.  Contacts, dentures or bridgework may not be worn into surgery. Leave your suitcase in the car. After surgery it may be brought to your room. For patients admitted to the hospital, discharge time is determined by your treatment team.   Patients discharged the day  of surgery will not be allowed to drive home.   Make arrangements for someone to be with you for the first 24 hours of your Same Day Discharge.    Please read over the following fact sheets that you were given:    __x__ Take these medicines the morning of surgery with A SIP OF WATER:    1. atorvastatin (LIPITOR) 40 MG tablet  2. fluticasone (FLONASE) 50 MCG/ACT nasal spray  3. tamsulosin (FLOMAX) 0.4 MG CAPS capsule  4.  5.  6.  ____ Fleet Enema (as directed)   __x__ Shower the night before and the morning of the surgery  ____ Use Benzoyl Peroxide Gel as instructed  ____ Use inhalers on the day of surgery  ____ Stop metformin 2 days prior to surgery    ____ Take 1/2 of usual insulin dose the night before surgery. No insulin the morning          of surgery.   ____ Stop Coumadin/Plavix/aspirin on   __x__ Stop Anti-inflammatories ibuprofen or aleve     May take tylenol   ____ Stop supplements until after surgery.    ____ Bring C-Pap to the hospital.

## 2020-06-03 NOTE — Addendum Note (Signed)
Addended by: Martha Clan on: 06/03/2020 04:12 PM   Modules accepted: Orders

## 2020-06-06 ENCOUNTER — Other Ambulatory Visit: Payer: Self-pay

## 2020-06-06 ENCOUNTER — Telehealth: Payer: Self-pay

## 2020-06-06 ENCOUNTER — Ambulatory Visit
Admission: RE | Admit: 2020-06-06 | Discharge: 2020-06-06 | Disposition: A | Discharge status: Home / Self Care | Payer: Medicare Other | Source: Ambulatory Visit | Attending: Urology | Admitting: Urology

## 2020-06-06 DIAGNOSIS — N179 Acute kidney failure, unspecified: Secondary | ICD-10-CM | POA: Diagnosis not present

## 2020-06-06 DIAGNOSIS — N390 Urinary tract infection, site not specified: Secondary | ICD-10-CM

## 2020-06-06 DIAGNOSIS — A499 Bacterial infection, unspecified: Secondary | ICD-10-CM

## 2020-06-06 LAB — MICROSCOPIC EXAMINATION

## 2020-06-06 LAB — URINALYSIS, COMPLETE
Bilirubin, UA: NEGATIVE
Glucose, UA: NEGATIVE
Nitrite, UA: NEGATIVE
Protein,UA: NEGATIVE
Specific Gravity, UA: >1.03 — ABNORMAL HIGH (ref 1.005–1.030)
Urobilinogen, Ur: 0.2 mg/dL (ref 0.2–1.0)
pH, UA: 5 (ref 5.0–7.5)

## 2020-06-06 LAB — CULTURE, URINE COMPREHENSIVE

## 2020-06-06 MED ORDER — CIPROFLOXACIN HCL 500 MG PO TABS: 500.0000 mg | ORAL_TABLET | Freq: Two times a day (BID) | ORAL | 0 refills | Status: AC

## 2020-06-06 NOTE — Telephone Encounter (Signed)
Called pt informed him of the information below pt gave verbal understanding. Rx sent.

## 2020-06-06 NOTE — Telephone Encounter (Signed)
-----   Message from Sondra Come, MD sent at 06/06/2020  9:52 AM EDT ----- Please start cipro 500mg  bID x 5 days for bacteria in urine, has holep scheduled Friday, thanks  Sunday, MD 06/06/2020

## 2020-06-08 ENCOUNTER — Other Ambulatory Visit: Payer: Self-pay

## 2020-06-08 ENCOUNTER — Other Ambulatory Visit
Admission: RE | Admit: 2020-06-08 | Discharge: 2020-06-08 | Disposition: A | Discharge status: Home / Self Care | Payer: Medicare Other | Source: Ambulatory Visit | Attending: Urology | Admitting: Urology

## 2020-06-08 DIAGNOSIS — Z20822 Contact with and (suspected) exposure to covid-19: Secondary | ICD-10-CM | POA: Diagnosis not present

## 2020-06-08 DIAGNOSIS — Z01812 Encounter for preprocedural laboratory examination: Secondary | ICD-10-CM | POA: Insufficient documentation

## 2020-06-08 LAB — SARS CORONAVIRUS 2 (TAT 6-24 HRS): SARS Coronavirus 2: NEGATIVE

## 2020-06-09 MED ORDER — CIPROFLOXACIN IN D5W 400 MG/200ML IV SOLN
400.0000 mg | INTRAVENOUS | Status: AC
  Administered 2020-06-10: 400 mg via INTRAVENOUS

## 2020-06-10 ENCOUNTER — Ambulatory Visit
Admission: RE | Admit: 2020-06-10 | Discharge: 2020-06-10 | Disposition: A | Discharge status: Home / Self Care | Payer: Medicare Other | Attending: Urology | Admitting: Urology

## 2020-06-10 ENCOUNTER — Ambulatory Visit: Payer: Medicare Other | Admitting: Anesthesiology

## 2020-06-10 ENCOUNTER — Other Ambulatory Visit: Payer: Self-pay

## 2020-06-10 ENCOUNTER — Encounter
Admission: RE | Disposition: A | Discharge status: Home / Self Care | Payer: Self-pay | Source: Home / Self Care | Attending: Urology

## 2020-06-10 ENCOUNTER — Encounter: Payer: Self-pay | Admitting: Urology

## 2020-06-10 DIAGNOSIS — G43109 Migraine with aura, not intractable, without status migrainosus: Secondary | ICD-10-CM | POA: Diagnosis not present

## 2020-06-10 DIAGNOSIS — I69844 Monoplegia of lower limb following other cerebrovascular disease affecting left non-dominant side: Secondary | ICD-10-CM | POA: Insufficient documentation

## 2020-06-10 DIAGNOSIS — Z7982 Long term (current) use of aspirin: Secondary | ICD-10-CM | POA: Insufficient documentation

## 2020-06-10 DIAGNOSIS — Z87891 Personal history of nicotine dependence: Secondary | ICD-10-CM | POA: Diagnosis not present

## 2020-06-10 DIAGNOSIS — R338 Other retention of urine: Secondary | ICD-10-CM | POA: Insufficient documentation

## 2020-06-10 DIAGNOSIS — E785 Hyperlipidemia, unspecified: Secondary | ICD-10-CM | POA: Insufficient documentation

## 2020-06-10 DIAGNOSIS — N401 Enlarged prostate with lower urinary tract symptoms: Secondary | ICD-10-CM | POA: Insufficient documentation

## 2020-06-10 DIAGNOSIS — Z79899 Other long term (current) drug therapy: Secondary | ICD-10-CM | POA: Diagnosis not present

## 2020-06-10 HISTORY — PX: HOLEP-LASER ENUCLEATION OF THE PROSTATE WITH MORCELLATION: SHX6641

## 2020-06-10 SURGERY — ENUCLEATION, PROSTATE, USING LASER, WITH MORCELLATION
Anesthesia: General | Site: Prostate

## 2020-06-10 MED ORDER — ONDANSETRON HCL 4 MG/2ML IJ SOLN: 4.0000 mg | Freq: Once | INTRAMUSCULAR | Status: AC | PRN

## 2020-06-10 MED ORDER — FAMOTIDINE 20 MG PO TABS
ORAL_TABLET | ORAL | Status: AC
  Filled 2020-06-10: qty 1

## 2020-06-10 MED ORDER — FENTANYL CITRATE (PF) 100 MCG/2ML IJ SOLN
INTRAMUSCULAR | Status: AC
  Filled 2020-06-10: qty 2

## 2020-06-10 MED ORDER — GLYCOPYRROLATE 0.2 MG/ML IJ SOLN
INTRAMUSCULAR | Status: DC | PRN
  Administered 2020-06-10: .2 mg via INTRAVENOUS

## 2020-06-10 MED ORDER — FENTANYL CITRATE (PF) 100 MCG/2ML IJ SOLN
INTRAMUSCULAR | Status: DC | PRN
  Administered 2020-06-10 (×2): 50 ug via INTRAVENOUS

## 2020-06-10 MED ORDER — FAMOTIDINE 20 MG PO TABS
20.0000 mg | ORAL_TABLET | Freq: Once | ORAL | Status: AC
  Administered 2020-06-10: 20 mg via ORAL

## 2020-06-10 MED ORDER — BELLADONNA ALKALOIDS-OPIUM 16.2-60 MG RE SUPP
RECTAL | Status: DC | PRN
  Administered 2020-06-10: 1 via RECTAL

## 2020-06-10 MED ORDER — PHENYLEPHRINE HCL (PRESSORS) 10 MG/ML IV SOLN
INTRAVENOUS | Status: DC | PRN
  Administered 2020-06-10 (×2): 100 ug via INTRAVENOUS

## 2020-06-10 MED ORDER — CIPROFLOXACIN IN D5W 400 MG/200ML IV SOLN
INTRAVENOUS | Status: AC
  Filled 2020-06-10: qty 200

## 2020-06-10 MED ORDER — ACETAMINOPHEN 10 MG/ML IV SOLN: 1000.0000 mg | Freq: Once | INTRAVENOUS | Status: DC | PRN

## 2020-06-10 MED ORDER — LACTATED RINGERS IV SOLN: INTRAVENOUS | Status: DC

## 2020-06-10 MED ORDER — BELLADONNA ALKALOIDS-OPIUM 16.2-60 MG RE SUPP
RECTAL | Status: AC
  Filled 2020-06-10: qty 1

## 2020-06-10 MED ORDER — PROPOFOL 10 MG/ML IV BOLUS
INTRAVENOUS | Status: DC | PRN
  Administered 2020-06-10: 160 mg via INTRAVENOUS

## 2020-06-10 MED ORDER — OXYCODONE HCL 5 MG PO TABS: 5.0000 mg | ORAL_TABLET | Freq: Once | ORAL | Status: DC | PRN

## 2020-06-10 MED ORDER — SUCCINYLCHOLINE CHLORIDE 20 MG/ML IJ SOLN
INTRAMUSCULAR | Status: DC | PRN
  Administered 2020-06-10: 100 mg via INTRAVENOUS

## 2020-06-10 MED ORDER — LIDOCAINE HCL (CARDIAC) PF 100 MG/5ML IV SOSY
PREFILLED_SYRINGE | INTRAVENOUS | Status: DC | PRN
  Administered 2020-06-10: 100 mg via INTRAVENOUS

## 2020-06-10 MED ORDER — EPHEDRINE SULFATE 50 MG/ML IJ SOLN
INTRAMUSCULAR | Status: DC | PRN
  Administered 2020-06-10: 10 mg via INTRAVENOUS

## 2020-06-10 MED ORDER — FENTANYL CITRATE (PF) 100 MCG/2ML IJ SOLN: 25.0000 ug | INTRAMUSCULAR | Status: DC | PRN

## 2020-06-10 MED ORDER — ROCURONIUM BROMIDE 100 MG/10ML IV SOLN
INTRAVENOUS | Status: DC | PRN
  Administered 2020-06-10: 50 mg via INTRAVENOUS

## 2020-06-10 MED ORDER — CIPROFLOXACIN HCL 500 MG PO TABS
500.0000 mg | ORAL_TABLET | Freq: Every day | ORAL | 0 refills | Status: AC
Start: 2020-06-10 — End: 2020-06-15

## 2020-06-10 MED ORDER — CHLORHEXIDINE GLUCONATE 0.12 % MT SOLN
OROMUCOSAL | Status: AC
  Filled 2020-06-10: qty 15

## 2020-06-10 MED ORDER — ORAL CARE MOUTH RINSE: 15.0000 mL | Freq: Once | OROMUCOSAL | Status: AC

## 2020-06-10 MED ORDER — SUGAMMADEX SODIUM 200 MG/2ML IV SOLN
INTRAVENOUS | Status: DC | PRN
  Administered 2020-06-10: 167.8 mg via INTRAVENOUS

## 2020-06-10 MED ORDER — OXYCODONE HCL 5 MG/5ML PO SOLN: 5.0000 mg | Freq: Once | ORAL | Status: DC | PRN

## 2020-06-10 MED ORDER — DEXAMETHASONE SODIUM PHOSPHATE 10 MG/ML IJ SOLN
INTRAMUSCULAR | Status: DC | PRN
  Administered 2020-06-10: 10 mg via INTRAVENOUS

## 2020-06-10 MED ORDER — ONDANSETRON HCL 4 MG/2ML IJ SOLN
INTRAMUSCULAR | Status: DC | PRN
  Administered 2020-06-10: 4 mg via INTRAVENOUS

## 2020-06-10 MED ORDER — HYDROCODONE-ACETAMINOPHEN 5-325 MG PO TABS: 1.0000 | ORAL_TABLET | ORAL | 0 refills | Status: AC | PRN

## 2020-06-10 MED ORDER — ONDANSETRON HCL 4 MG/2ML IJ SOLN
INTRAMUSCULAR | Status: AC
  Administered 2020-06-10: 4 mg via INTRAVENOUS
  Filled 2020-06-10: qty 2

## 2020-06-10 MED ORDER — CHLORHEXIDINE GLUCONATE 0.12 % MT SOLN
15.0000 mL | Freq: Once | OROMUCOSAL | Status: AC
  Administered 2020-06-10: 15 mL via OROMUCOSAL

## 2020-06-10 SURGICAL SUPPLY — 34 items
ADAPTER IRRIG TUBE 2 SPIKE SOL (ADAPTER) ×6 IMPLANT
BAG URO DRAIN 4000ML (MISCELLANEOUS) ×3 IMPLANT
CATH FOLEY 3WAY 30CC 24FR (CATHETERS) ×2
CATH URETL 5X70 OPEN END (CATHETERS) ×3 IMPLANT
CATH URTH STD 24FR FL 3W 2 (CATHETERS) ×1 IMPLANT
CONTAINER COLLECT MORCELLATR (MISCELLANEOUS) ×1 IMPLANT
DRAPE UTILITY 15X26 TOWEL STRL (DRAPES) IMPLANT
ELECT BIVAP BIPO 22/24 DONUT (ELECTROSURGICAL)
ELECTRD BIVAP BIPO 22/24 DONUT (ELECTROSURGICAL) IMPLANT
FILTER OVERFLOW MORCELLATOR (FILTER) ×1 IMPLANT
GLOVE BIOGEL PI IND STRL 7.5 (GLOVE) ×1 IMPLANT
GLOVE BIOGEL PI INDICATOR 7.5 (GLOVE) ×2
GOWN STRL REUS W/ TWL LRG LVL3 (GOWN DISPOSABLE) ×1 IMPLANT
GOWN STRL REUS W/ TWL XL LVL3 (GOWN DISPOSABLE) ×1 IMPLANT
GOWN STRL REUS W/TWL LRG LVL3 (GOWN DISPOSABLE) ×2
GOWN STRL REUS W/TWL XL LVL3 (GOWN DISPOSABLE) ×2
HOLDER FOLEY CATH W/STRAP (MISCELLANEOUS) ×3 IMPLANT
KIT TURNOVER CYSTO (KITS) ×3 IMPLANT
LASER FIBER 550M SMARTSCOPE (Laser) ×3 IMPLANT
MBRN O SEALING YLW 17 FOR INST (MISCELLANEOUS) ×3
MEMBRANE SLNG YLW 17 FOR INST (MISCELLANEOUS) ×1 IMPLANT
MORCELLATOR COLLECT CONTAINER (MISCELLANEOUS) ×3
MORCELLATOR OVERFLOW FILTER (FILTER) ×3
MORCELLATOR ROTATION 4.75 335 (MISCELLANEOUS) ×3 IMPLANT
PACK CYSTO AR (MISCELLANEOUS) ×3 IMPLANT
SET CYSTO W/LG BORE CLAMP LF (SET/KITS/TRAYS/PACK) ×3 IMPLANT
SET IRRIG Y TYPE TUR BLADDER L (SET/KITS/TRAYS/PACK) ×3 IMPLANT
SLEEVE PROTECTION STRL DISP (MISCELLANEOUS) ×6 IMPLANT
SOL .9 NS 3000ML IRR  AL (IV SOLUTION) ×20
SOL .9 NS 3000ML IRR UROMATIC (IV SOLUTION) ×4 IMPLANT
SURGILUBE 2OZ TUBE FLIPTOP (MISCELLANEOUS) ×3 IMPLANT
SYRINGE IRR TOOMEY STRL 70CC (SYRINGE) ×3 IMPLANT
TUBE PUMP MORCELLATOR PIRANHA (TUBING) ×3 IMPLANT
WATER STERILE IRR 1000ML POUR (IV SOLUTION) ×3 IMPLANT

## 2020-06-10 NOTE — Anesthesia Procedure Notes (Signed)
Procedure Name: Intubation Date/Time: 06/10/2020 11:43 AM Performed by: Nelda Marseille, CRNA Pre-anesthesia Checklist: Patient identified, Patient being monitored, Timeout performed, Emergency Drugs available and Suction available Patient Re-evaluated:Patient Re-evaluated prior to induction Oxygen Delivery Method: Circle system utilized Preoxygenation: Pre-oxygenation with 100% oxygen Induction Type: IV induction Ventilation: Mask ventilation without difficulty Laryngoscope Size: Mac, 3 and McGraph Grade View: Grade I Tube type: Oral Tube size: 7.0 mm Number of attempts: 1 Airway Equipment and Method: Stylet and Video-laryngoscopy Placement Confirmation: ETT inserted through vocal cords under direct vision,  positive ETCO2 and breath sounds checked- equal and bilateral Secured at: 21 cm Tube secured with: Tape Dental Injury: Teeth and Oropharynx as per pre-operative assessment

## 2020-06-10 NOTE — Op Note (Signed)
Date of procedure: 06/10/20  Preoperative diagnosis:  1. BPH with urinary retention   Postoperative diagnosis:  1. Same  Procedure: 1. HoLEP (Holmium Laser Enucleation of the Prostate)  Surgeon: Legrand Rams, MD  Anesthesia: General  Complications: None  Intraoperative findings:  1. Moderate trabeculations, no tumors 2. Ureteral orifices and verumontanum intact at conclusion of case  EBL: Minimal  Specimens: Prostate chips  Enucleation time: 34 minutes  Morcellation time: 16 minutes  Intra-op weight: 80g  Drains: 68F 3-way, 50cc in balloon  Indication: William Bennett is a 74 y.o. patient with BPH, 90g prostate, and foley dependent urinary retention.  After reviewing the management options for treatment, they elected to proceed with the above surgical procedure(s). We have discussed the potential benefits and risks of the procedure, side effects of the proposed treatment, the likelihood of the patient achieving the goals of the procedure, and any potential problems that might occur during the procedure or recuperation.  We specifically discussed the risks of bleeding, infection, hematuria and clot retention, need for additional procedures, possible overnight hospital stay, temporary urgency and incontinence, rare long-term incontinence, and retrograde ejaculation.  Informed consent has been obtained.   Description of procedure:  The patient was taken to the operating room and general anesthesia was induced.  The patient was placed in the dorsal lithotomy position, prepped and draped in the usual sterile fashion, and preoperative antibiotics(cipro) were administered.  SCDs were placed for DVT prophylaxis.  A preoperative time-out was performed.   The 19 French continuous flow resectoscope was inserted into the urethra using the visual obturator  The prostate was large with obstructive lateral lobes. The bladder was thoroughly inspected and notable for moderate trabeculations.   The ureteral orifices were located in orthotopic position.  The laser was set to 2 J and 50 Hz and was used to make an incision at the 6 o'clock position to the level of the capsule from the bladder neck to the verumontanum.  The lateral lobes were then incised circumferentially until they were disconnected from the surrounding tissue.  The capsule was examined and laser was used for meticulous hemostasis.    The 82 French resectoscope was then switched out for the 26 French nephroscope and the lobes were morcellated and the tissue sent to pathology.  A 24 French three-way catheter was inserted easily, and CBI was initiated.  50cc were placed in the balloon.  Urine was clear.  The catheter irrigated easily with a Toomey syringe.  A belladonna suppository was placed.  The patient tolerated the procedure well without any immediate complications and was extubated and transferred to the recovery room in stable condition.  Urine was clear on fast CBI.  Disposition: Stable to PACU  Plan: Wean CBI in PACU, anticipate discharge home today with void trial in clinic in 2-3 days  Legrand Rams, MD 06/10/2020

## 2020-06-10 NOTE — Transfer of Care (Signed)
Immediate Anesthesia Transfer of Care Note  Patient: William Bennett  Procedure(s) Performed: HOLEP-LASER ENUCLEATION OF THE PROSTATE WITH MORCELLATION (N/A Prostate)  Patient Location: PACU  Anesthesia Type:General  Level of Consciousness: sedated  Airway & Oxygen Therapy: Patient Spontanous Breathing and Patient connected to face mask oxygen  Post-op Assessment: Report given to RN and Post -op Vital signs reviewed and stable  Post vital signs: Reviewed and stable  Last Vitals:  Vitals Value Taken Time  BP 134/76 06/10/20 1255  Temp 36.1 C 06/10/20 1255  Pulse 85 06/10/20 1304  Resp 16 06/10/20 1304  SpO2 99 % 06/10/20 1304  Vitals shown include unvalidated device data.  Last Pain:  Vitals:   06/10/20 1255  TempSrc:   PainSc: Asleep         Complications: No complications documented.

## 2020-06-10 NOTE — Progress Notes (Signed)
EKG ordered for pre-op, per Dr. Suzan Slick not needed.

## 2020-06-10 NOTE — Anesthesia Preprocedure Evaluation (Signed)
Anesthesia Evaluation  Patient identified by MRN, date of birth, ID band Patient awake    Reviewed: Allergy & Precautions, NPO status , Patient's Chart, lab work & pertinent test results  History of Anesthesia Complications Negative for: history of anesthetic complications  Airway Mallampati: II  TM Distance: >3 FB Neck ROM: Full    Dental no notable dental hx. (+) Teeth Intact, Dental Advisory Given   Pulmonary neg pulmonary ROS, neg sleep apnea, neg COPD, Patient abstained from smoking.Not current smoker,    Pulmonary exam normal breath sounds clear to auscultation       Cardiovascular Exercise Tolerance: Good METS(-) hypertension(-) CAD and (-) Past MI negative cardio ROS  (-) dysrhythmias  Rhythm:Regular Rate:Normal - Systolic murmurs    Neuro/Psych  Headaches, Residual left leg weakness CVA, Residual Symptoms negative psych ROS   GI/Hepatic neg GERD  ,(+)     (-) substance abuse  ,   Endo/Other  neg diabetes  Renal/GU negative Renal ROS     Musculoskeletal   Abdominal   Peds  Hematology   Anesthesia Other Findings Past Medical History: No date: BPH (benign prostatic hyperplasia) No date: Heart murmur     Comment:  as child No date: Stroke [facility])     Comment:  TIA  Reproductive/Obstetrics                             Anesthesia Physical Anesthesia Plan  ASA: III  Anesthesia Plan: General   Post-op Pain Management:    Induction: Intravenous  PONV Risk Score and Plan: 3 and Ondansetron and Dexamethasone  Airway Management Planned: Oral ETT  Additional Equipment: None  Intra-op Plan:   Post-operative Plan: Extubation in OR  Informed Consent: I have reviewed the patients History and Physical, chart, labs and discussed the procedure including the risks, benefits and alternatives for the proposed anesthesia with the patient or authorized representative who has indicated  his/her understanding and acceptance.     Dental advisory given  Plan Discussed with: CRNA and Surgeon  Anesthesia Plan Comments: (Discussed risks of anesthesia with patient, including PONV, sore throat, lip/dental damage. Rare risks discussed as well, such as cardiorespiratory and neurological sequelae. Patient understands.)        Anesthesia Quick Evaluation

## 2020-06-10 NOTE — H&P (Signed)
UROLOGY H&P UPDATE  Agree with prior H&P dated 05/25/2020. 90 g prostate with Foley dependent urinary retention.  Cardiac: RRR Lungs: CTA bilaterally  Laterality: n/a Procedure: HOLEP  Urine: Urine culture 6/18 with 100 K staph epidermidis, has been on culture appropriate antibiotics  We discussed the risks and benefits of HoLEP at length.  The procedure requires general anesthesia and takes 2 to 3 hours, and a holmium laser is used to enucleate the prostate and push this tissue into the bladder.  A morcellator is then used to remove this tissue, which is sent for pathology.  The vast majority of patients are able to discharge the same day with a catheter in place for 2 to 3 days, and will follow-up in clinic for a voiding trial.  Approximately 5% of patients will be admitted overnight to monitor the urine, or if they have multiple co-morbidities.  We specifically discussed the risks of bleeding, infection, retrograde ejaculation, temporary urgency and urge incontinence, very low risk of long-term incontinence, pathologic evaluation of prostate tissue and possible detection of prostate cancer or other malignancy, and possible need for additional procedures.   Sondra Come, MD 06/10/2020

## 2020-06-10 NOTE — Discharge Instructions (Signed)
AMBULATORY SURGERY  DISCHARGE INSTRUCTIONS  1) The drugs that you were given will stay in your system until tomorrow so for the next 24 hours you should not: A) Drive an automobile B) Make any legal decisions C) Drink any alcoholic beverage  2) You may resume regular meals tomorrow.  Today it is better to start with liquids and gradually work up to solid foods. You may eat anything you prefer, but it is better to start with liquids, then soup and crackers, and gradually work up to solid foods.  3) Please notify your doctor immediately if you have any unusual bleeding, trouble breathing, redness and pain at the surgery site, drainage, fever, or pain not relieved by medication.  Additional Instructions:  Please contact your physician with any problems or Same Day Surgery at [phone], Monday through Friday 6 am to 4 pm, or [facility] Main number at [phone]. 

## 2020-06-10 NOTE — Progress Notes (Signed)
Urine clear, CBI stopped at 1312. removed from Foley bag.

## 2020-06-11 NOTE — Anesthesia Postprocedure Evaluation (Signed)
Anesthesia Post Note  Patient: William Bennett  Procedure(s) Performed: HOLEP-LASER ENUCLEATION OF THE PROSTATE WITH MORCELLATION (N/A Prostate)  Patient location during evaluation: PACU Anesthesia Type: General Level of consciousness: awake and alert Pain management: pain level controlled Vital Signs Assessment: post-procedure vital signs reviewed and stable Respiratory status: spontaneous breathing, nonlabored ventilation, respiratory function stable and patient connected to nasal cannula oxygen Cardiovascular status: blood pressure returned to baseline and stable Postop Assessment: no apparent nausea or vomiting Anesthetic complications: no   No complications documented.   Last Vitals:  Vitals:   06/10/20 1337 06/10/20 1351  BP: (!) 146/85 (!) 154/83  Pulse: 79 86  Resp: 15 18  Temp: 36.7 C 36.5 C  SpO2: 98% 100%    Last Pain:  Vitals:   06/10/20 1351  TempSrc: Temporal  PainSc: 0-No pain                 Corinda Gubler

## 2020-06-12 ENCOUNTER — Encounter: Payer: Self-pay | Admitting: Urology

## 2020-06-13 ENCOUNTER — Ambulatory Visit (INDEPENDENT_AMBULATORY_CARE_PROVIDER_SITE_OTHER): Payer: Medicare Other | Admitting: Physician Assistant

## 2020-06-13 ENCOUNTER — Ambulatory Visit: Payer: Medicare Other | Admitting: Physician Assistant

## 2020-06-13 ENCOUNTER — Other Ambulatory Visit: Payer: Self-pay

## 2020-06-13 DIAGNOSIS — R339 Retention of urine, unspecified: Secondary | ICD-10-CM | POA: Diagnosis not present

## 2020-06-13 LAB — BLADDER SCAN AMB NON-IMAGING: Scan Result: 16

## 2020-06-13 NOTE — Patient Instructions (Signed)

## 2020-06-13 NOTE — Progress Notes (Signed)
Fill and Pull Catheter Removal  Patient is present today for a catheter removal.  Patient was cleaned and prepped in a sterile fashion of sterile saline was instilled into the bladder when the patient felt the urge to urinate. 2ml of water was then drained from the balloon.  A 24FR three-way foley cath was removed from the bladder no complications were noted .  Patient was then given some time to void on their own.  Patient can void on their own after some time.  Patient tolerated well.  Performed by: Carman Ching, PA-C   Follow up/ Additional notes: Push fluids and RTC this afternoon for PVR.

## 2020-06-13 NOTE — Progress Notes (Signed)
Afternoon Follow-up  Patient returned to clinic this afternoon for repeat PVR.  He reports drinking approximately 56oz of fluid.  He has been able to urinate.  He has had urinary leakage.  PVR 28mL.  Results for orders placed or performed in visit on 06/13/20  Bladder Scan (Post Void Residual) in office  Result Value Ref Range   Scan Result 16     Voiding trial passed.  Reviewed normal postoperative findings including dysuria, gross hematuria, and urinary leakage.  Instructed patient on Kegel exercises and counseled him to complete these 3x10 sets daily to increase urinary control.  Written Kegel exercise instructions also provided today.  Follow-up: 10-week postop follow-up and surgical pathology with Dr. Richardo Hanks (scheduled)

## 2020-06-17 LAB — SURGICAL PATHOLOGY

## 2020-06-21 ENCOUNTER — Telehealth: Payer: Self-pay

## 2020-06-21 NOTE — Telephone Encounter (Signed)
Called pt informed him of the information below. Pt gave verbal understanding.  

## 2020-06-21 NOTE — Telephone Encounter (Signed)
-----   Message from Sondra Come, MD sent at 06/21/2020  1:01 PM EDT ----- No prostate cancer seen on HOLEP specimen, good news, keep follow up as scheduled, thanks  Legrand Rams, MD 06/21/2020

## 2020-07-20 ENCOUNTER — Telehealth: Payer: Self-pay

## 2020-07-20 NOTE — Progress Notes (Signed)
Error

## 2020-07-20 NOTE — Telephone Encounter (Signed)
Patient called stating that his dysuria and leakage post HOLEP is worsening this week rather than getting better. He would like an appt for further evaluation. Apt made with PA tomorrow

## 2020-07-21 ENCOUNTER — Encounter: Payer: Self-pay | Admitting: Urology

## 2020-07-21 ENCOUNTER — Other Ambulatory Visit: Payer: Self-pay

## 2020-07-21 ENCOUNTER — Ambulatory Visit (INDEPENDENT_AMBULATORY_CARE_PROVIDER_SITE_OTHER): Payer: Medicare Other | Admitting: Urology

## 2020-07-21 VITALS — BP 151/80 | HR 79 | Ht 69.0 in | Wt 185.0 lb

## 2020-07-21 DIAGNOSIS — N138 Other obstructive and reflux uropathy: Secondary | ICD-10-CM

## 2020-07-21 DIAGNOSIS — N401 Enlarged prostate with lower urinary tract symptoms: Secondary | ICD-10-CM

## 2020-07-21 LAB — BLADDER SCAN AMB NON-IMAGING

## 2020-07-21 MED ORDER — URIBEL 118 MG PO CAPS: 1.0000 | ORAL_CAPSULE | Freq: Two times a day (BID) | ORAL | 1 refills | Status: DC | PRN

## 2020-07-21 NOTE — Progress Notes (Signed)
07/21/2020 10:11 PM   William Bennett September 15, 1946 [id number]  Referring provider: Kandyce Rud, MD [phone] William Bennett [facility] [place],  [place] [address]  Chief Complaint  Patient presents with  . Dysuria    HPI: William Bennett is a 74 y.o. male who is status post whole up he presents today with issues with dysuria and urinary leakage.  He underwent HoLEP on June 10, 2020 with Dr. Richardo Hanks. Pathology showed benign prostatic tissue with nodular hyperplasia and partial atrophy. Negative for malignancy.   Symptoms started after catheter was removed s/p HoLEP. He had some bright red hematuria and a few days later it was pink in color. Hematuria has resolved. He has had persistent burning after urination. He burns with urge to urinate and/or when he can not urinate. Burning sensation last about 45 seconds and will go away if he sits down. He has mild burning sensation on occasion with urination. Reports a good strong stream.  Today, his PVR is 0 mL. He reports some leakage and he is wearing pads. His pads become heavy and soak through his pants when he has no urge to urinate. He is drinking up to 32 oz of water per day. He has more fluids in the evening time.   Patient denies any modifying or aggravating factors.  Patient denies any suprapubic/flank pain.  Patient denies any fevers, chills, nausea or vomiting.   He took 6 pills of antibiotics after procedure and he continues to have hard stool. He is interested in taking stool softeners if possible.  UA today > 30 WBC and 3-10 RBC's.   PMH: Past Medical History:  Diagnosis Date  . BPH (benign prostatic hyperplasia)   . Heart murmur    as child  . Stroke [facility])    TIA    Surgical History: Past Surgical History:  Procedure Laterality Date  . COLONOSCOPY W/ POLYPECTOMY    . HOLEP-LASER ENUCLEATION OF THE PROSTATE WITH MORCELLATION N/A 06/10/2020   Procedure: HOLEP-LASER ENUCLEATION OF THE  PROSTATE WITH MORCELLATION;  Surgeon: William Come, MD;  Location: [facility] ORS;  Service: Urology;  Laterality: N/A;  . thumb surgery Left    ORIF    . TOE SURGERY Right     Home Medications:  Allergies as of 07/21/2020      Reactions   Penicillins Rash   Did it involve swelling of the face/tongue/throat, SOB, or low BP? No Did it involve sudden or severe rash/hives, skin peeling, or any reaction on the inside of your mouth or nose? Yes Did you need to seek medical attention at a hospital or doctor's office? Unknown When did it last happen?"Years ago" If all above answers are "NO", may proceed with cephalosporin use.      Medication List       Accurate as of July 21, 2020 10:11 PM. If you have any questions, ask your nurse or doctor.        aspirin 81 MG EC tablet Take 81 mg by mouth daily.   atorvastatin 40 MG tablet Commonly known as: Lipitor Take 1 tablet (40 mg total) by mouth daily.   fluticasone 50 MCG/ACT nasal spray Commonly known as: FLONASE Place 1-2 sprays into both nostrils daily.   Uribel 118 MG Caps Take 1 capsule (118 mg total) by mouth 2 (two) times daily as needed. Started by: William Cowboy, PA-C       Allergies:  Allergies  Allergen Reactions  . Penicillins  Rash    Did it involve swelling of the face/tongue/throat, SOB, or low BP? No Did it involve sudden or severe rash/hives, skin peeling, or any reaction on the inside of your mouth or nose? Yes Did you need to seek medical attention at a hospital or doctor's office? Unknown When did it last happen?"Years ago" If all above answers are "NO", may proceed with cephalosporin use.     Family History: No family history on file.  Social History:  reports that he has never smoked. He has never used smokeless tobacco. He reports previous alcohol use. He reports previous drug use.  ROS: Pertinent ROS in HPI  Physical Exam: BP (!) 151/80 (BP Location: Left Arm, Patient Position:  Sitting, Cuff Size: Normal)   Pulse 79   Ht 5\' 9"  (1.753 m)   Wt 185 lb (83.9 kg)   BMI 27.32 kg/m   Constitutional:  Well nourished. Alert and oriented, No acute distress. HEENT: [place] AT, mask in place.  Trachea midline Cardiovascular: No clubbing, cyanosis, or edema. Respiratory: Normal respiratory effort, no increased work of breathing. Neurologic: Grossly intact, no focal deficits, moving all 4 extremities. Psychiatric: Normal mood and affect.  Laboratory Data: Lab Results  Component Value Date   WBC 11.0 (H) 05/16/2020   HGB 14.4 05/16/2020   HCT 39.7 05/16/2020   MCV 84.1 05/16/2020   PLT 204 05/16/2020    Lab Results  Component Value Date   CREATININE 1.04 05/25/2020     Lab Results  Component Value Date   HGBA1C 5.1 04/13/2019       Component Value Date/Time   CHOL 190 04/13/2019 0454   HDL 52 04/13/2019 0454   CHOLHDL 3.7 04/13/2019 0454   VLDL 10 04/13/2019 0454   LDLCALC 128 (H) 04/13/2019 0454    Lab Results  Component Value Date   AST 33 05/16/2020   Lab Results  Component Value Date   ALT 31 05/16/2020    Urinalysis Component     Latest Ref Rng & Units 07/21/2020  Specific Gravity, UA     1.005 - 1.030 1.025  pH, UA     5.0 - 7.5 5.5  Color, UA     Yellow Yellow  Appearance Ur     Clear Cloudy (A)  Leukocytes,UA     Negative 1+ (A)  Protein,UA     Negative/Trace Trace (A)  Glucose, UA     Negative Negative  Ketones, UA     Negative Negative  RBC, UA     Negative 2+ (A)  Bilirubin, UA     Negative Negative  Urobilinogen, Ur     0.2 - 1.0 mg/dL 0.2  Nitrite, UA     Negative Negative  Microscopic Examination      See below:   Component     Latest Ref Rng & Units 07/21/2020  WBC, UA     0 - 5 /hpf >30 (A)  RBC     0 - 2 /hpf 3-10 (A)  Epithelial Cells (non renal)     0 - 10 /hpf 0-10  Renal Epithel, UA     None seen /hpf 0-10 (A)  Bacteria, UA     None seen/Few None seen    I have reviewed the labs.   Pertinent  Imaging: Results for orders placed or performed in visit on 07/21/20  Bladder Scan (Post Void Residual) in office  Result Value Ref Range   Scan Result 69mL     Assessment & Plan:  1. BPH with LU TS Status post HoLEP PVR is 0 mL.  Most bothersome symptoms are burning with urge to urinate and after urination. Reassured the patient that what he is experiencing is as expected after HoLEP Encouraged patient to continue the Kegel's Okay to take stool softeners  Uribel Rx sent to [facility].  Follow up with Dr. Richardo Hanks 08/17/2020.  Return for keep follow up with Dr. Richardo Hanks on 08/17/2020 .  These notes generated with voice recognition software. I apologize for typographical errors.  Georges Mouse, am acting as a Neurosurgeon for [employer],  I have reviewed the above documentation for accuracy and completeness, and I agree with the above.    William Cowboy, PA-C  [facility] [address]  [address] [place], [place] [address] [phone]

## 2020-07-22 LAB — URINALYSIS, COMPLETE
Bilirubin, UA: NEGATIVE
Glucose, UA: NEGATIVE
Ketones, UA: NEGATIVE
Nitrite, UA: NEGATIVE
Specific Gravity, UA: 1.025 (ref 1.005–1.030)
Urobilinogen, Ur: 0.2 mg/dL (ref 0.2–1.0)
pH, UA: 5.5 (ref 5.0–7.5)

## 2020-07-22 LAB — MICROSCOPIC EXAMINATION
Bacteria, UA: NONE SEEN
WBC, UA: >30 /hpf — AB (ref 0–5)

## 2020-08-17 ENCOUNTER — Ambulatory Visit (INDEPENDENT_AMBULATORY_CARE_PROVIDER_SITE_OTHER): Payer: Medicare Other | Admitting: Urology

## 2020-08-17 ENCOUNTER — Other Ambulatory Visit: Payer: Self-pay

## 2020-08-17 ENCOUNTER — Encounter: Payer: Self-pay | Admitting: Urology

## 2020-08-17 VITALS — BP 139/82 | HR 81 | Ht 69.0 in | Wt 185.0 lb

## 2020-08-17 DIAGNOSIS — N138 Other obstructive and reflux uropathy: Secondary | ICD-10-CM

## 2020-08-17 DIAGNOSIS — N401 Enlarged prostate with lower urinary tract symptoms: Secondary | ICD-10-CM

## 2020-08-17 LAB — BLADDER SCAN AMB NON-IMAGING

## 2020-08-17 NOTE — Progress Notes (Signed)
   08/17/2020 8:29 AM   [name] 05/25/1946 [id number]  Reason for visit: Follow up HOLEP  HPI: I saw William Bennett back in urology clinic for follow-up after undergoing a HOLEP for Foley dependent urinary retention on 06/10/2020.  He also has a history of stroke.  80 g of tissue were removed with no evidence of malignancy, and there were moderate bladder trabeculations.  He reports he continues to improve after HOLEP.  He is voiding with a strong stream.  PVR is reasonable at 160 mL in clinic today.  He continues to have some stress incontinence and is requiring a pad, but this has continued to improve over the last month.  He has nocturia 1-2 times at night.  Overall he is very happy with how he is doing at this time.  He is still pushing a significant amount of fluids which is likely contributing to his frequency and leakage.  IPSS score today is 4, with quality of life mostly satisfied.  We discussed behavioral strategies at length, I encouraged timed voiding, drinking a normal amount of fluids, and counseled him that his urinary symptoms will continue to improve as his bladder adjusts after undergoing HOLEP.  RTC 6 months for IPSS and PVR, sooner if problems   Sondra Come, MD  [facility] [address] [place], [place] [address] [phone]

## 2020-08-17 NOTE — Patient Instructions (Signed)
Okay to return to drinking your normal amount of fluids  Continue Kegel exercises to strengthen the pelvic floor and improve the leakage  Avoid beverages that will irritate your bladder like caffeine, soda, tea, and diet drinks  Continue trying to urinate on a schedule every 2-3 hours during the day.

## 2021-02-16 ENCOUNTER — Encounter: Payer: Self-pay | Admitting: Urology

## 2021-02-16 ENCOUNTER — Other Ambulatory Visit: Payer: Self-pay

## 2021-02-16 ENCOUNTER — Ambulatory Visit (INDEPENDENT_AMBULATORY_CARE_PROVIDER_SITE_OTHER): Payer: Medicare Other | Admitting: Urology

## 2021-02-16 ENCOUNTER — Ambulatory Visit: Payer: Medicare Other | Admitting: Urology

## 2021-02-16 VITALS — BP 155/79 | HR 79 | Ht 68.0 in | Wt 183.0 lb

## 2021-02-16 DIAGNOSIS — N138 Other obstructive and reflux uropathy: Secondary | ICD-10-CM | POA: Diagnosis not present

## 2021-02-16 DIAGNOSIS — N401 Enlarged prostate with lower urinary tract symptoms: Secondary | ICD-10-CM

## 2021-02-16 LAB — BLADDER SCAN AMB NON-IMAGING: Scan Result: 22

## 2021-02-16 NOTE — Progress Notes (Signed)
   02/16/2021 4:39 PM   [name] 1946-12-15 [id number]  Reason for visit: Follow up BPH status post HOLEP  HPI: I saw Mr. Drost back in urology clinic for follow-up after undergoing a HOLEP for Foley dependent urinary retention on 06/10/2020.  He also has a history of stroke.  80 g of tissue were removed with no evidence of malignancy, and there were moderate bladder trabeculations.  He is voiding with a strong stream.  PVR is 12mL in clinic today. IPSS score today is 3, with quality of life mostly satisfied.  He is having minimal stress incontinence that has continued to improve.  Overall he is very happy with how he is doing.  We discussed behavioral strategies at length, I encouraged timed voiding, drinking a normal amount of fluids, and counseled him that his urinary symptoms will continue to improve as his bladder adjusts after undergoing HOLEP.  Continue Kegel exercises.  RTC 1 year with PVR  Sondra Come, MD  [facility] [address], [address] [place], [place] [address] [phone]

## 2021-02-16 NOTE — Patient Instructions (Signed)

## 2021-08-17 IMAGING — US US RENAL
1 series · 14 of 25 positions shown · non-contrast
Comparison: None.

CLINICAL DATA: Acute kidney injury.

EXAM:
RENAL / URINARY TRACT ULTRASOUND COMPLETE

[Series 1: us renal · 0.23mm/px · 14 of 73 slices shown]
[im 1/73]
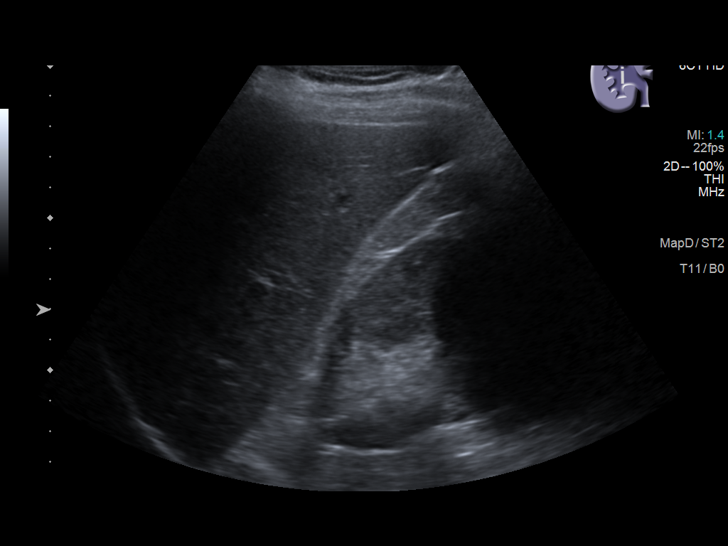
[im 7/73]
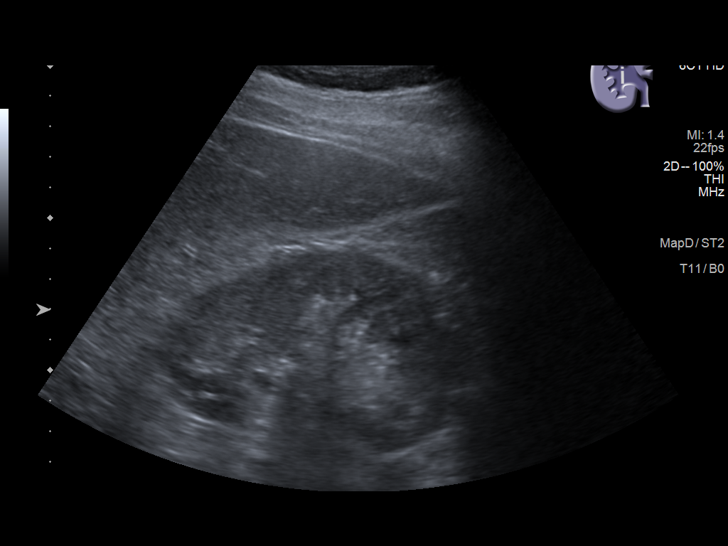
[im 13/73]
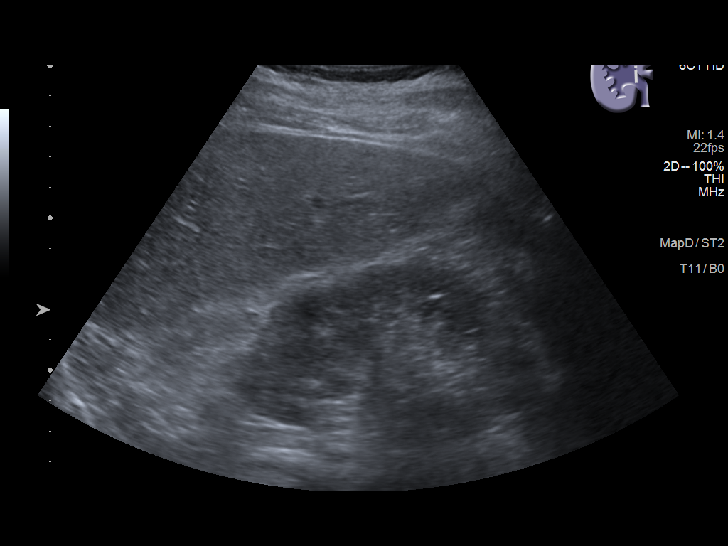
[im 19/73]
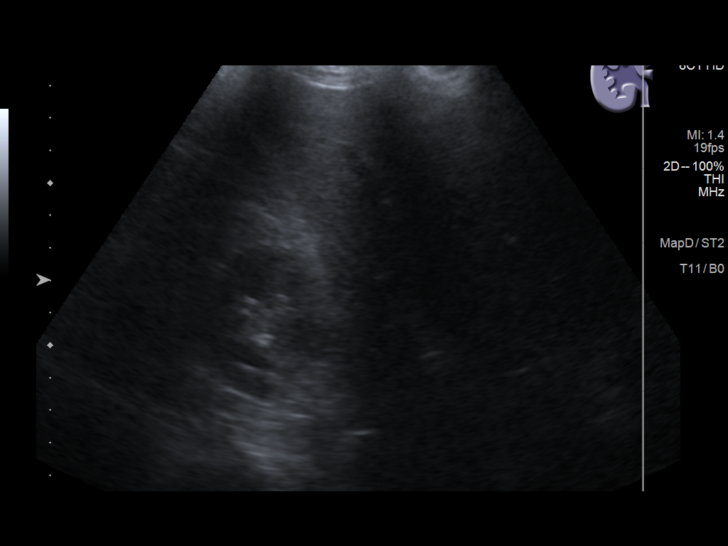
[im 25/73]
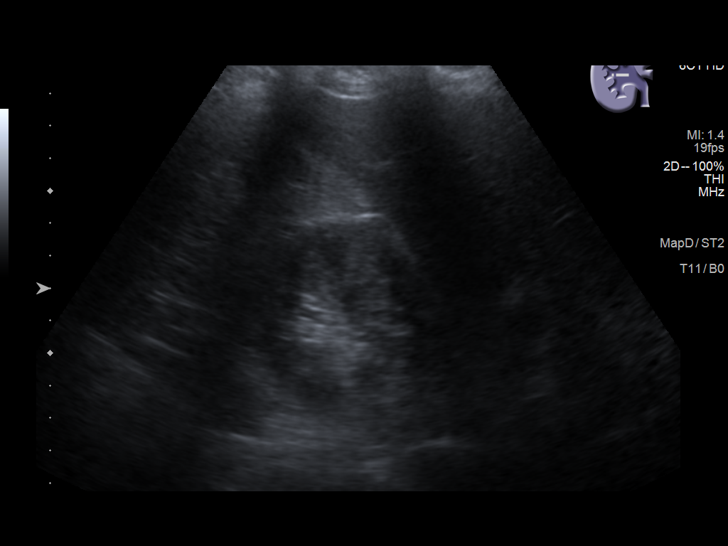
[im 28/73]
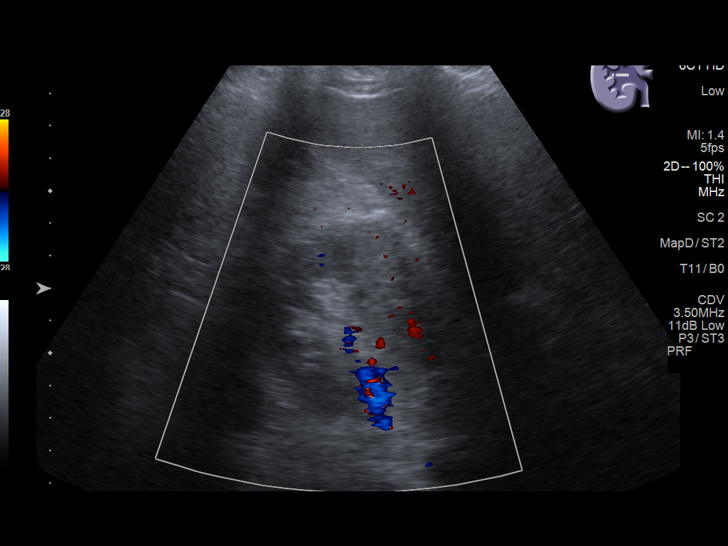
[im 34/73]
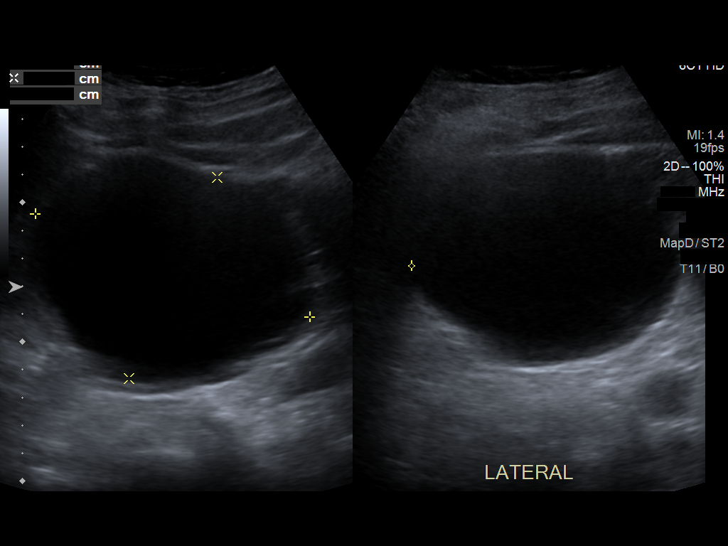
[im 40/73]
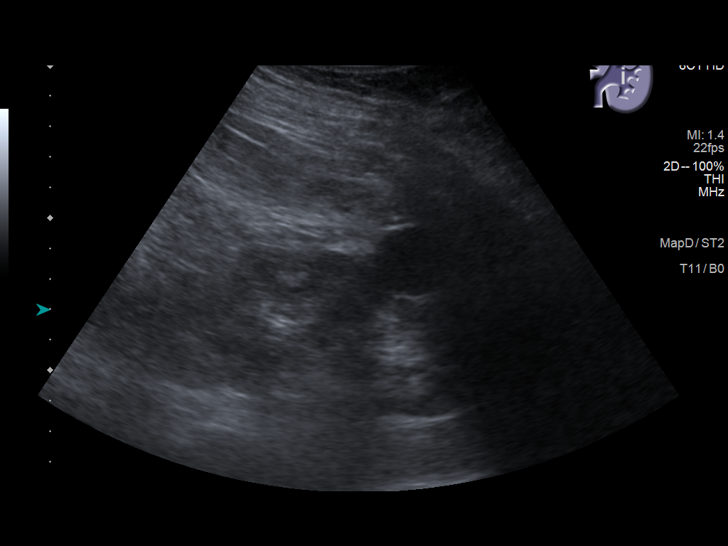
[im 46/73]
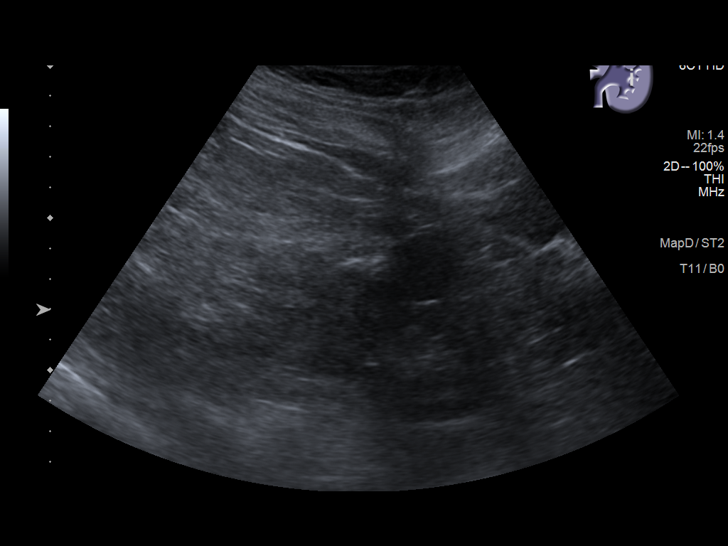
[im 49/73]
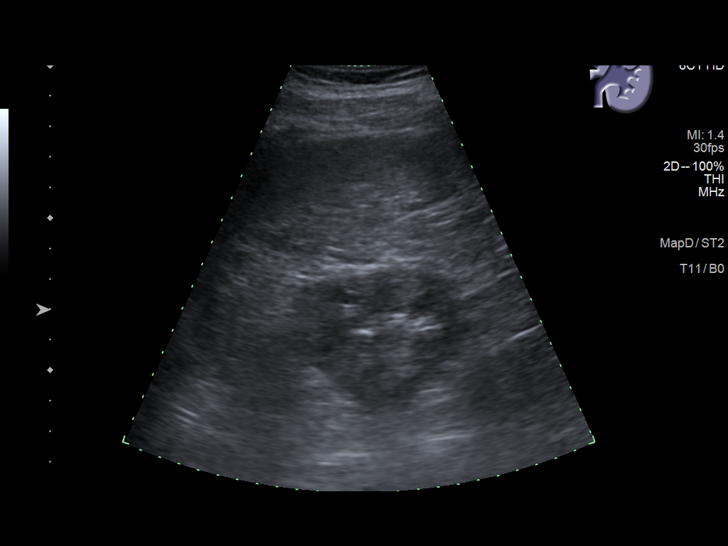
[im 55/73]
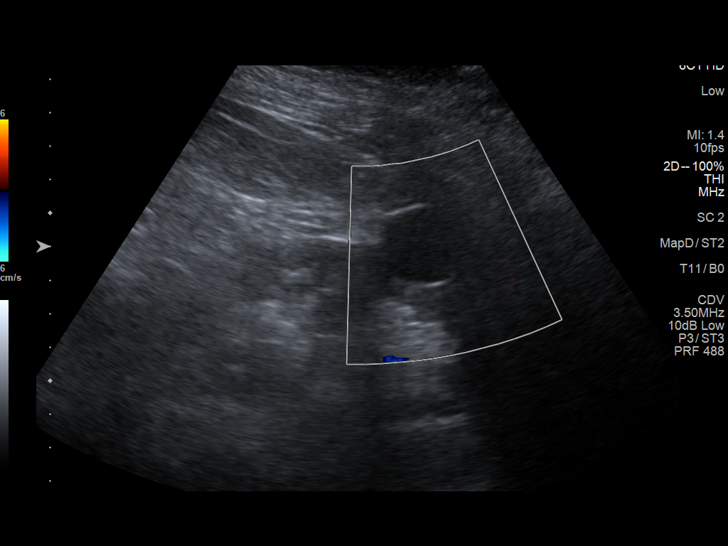
[im 61/73]
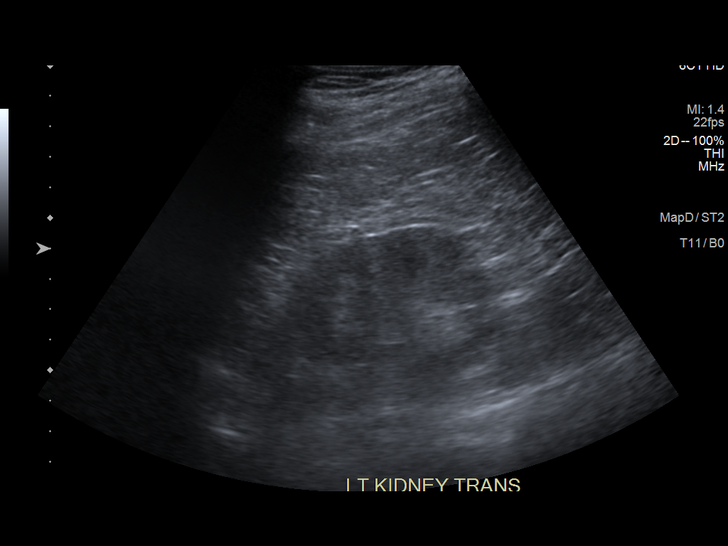
[im 67/73]
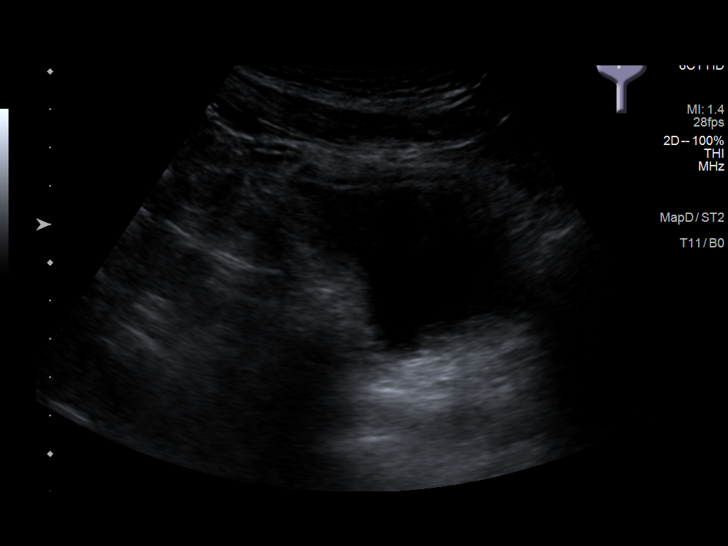
[im 73/73]
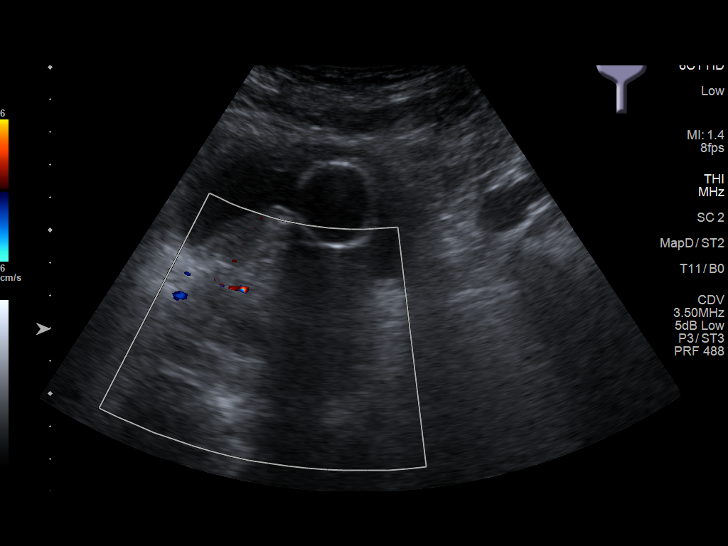

[14 of 25 positions shown; findings below may reference images not displayed]

FINDINGS: Right Kidney:

Renal measurements: 10.1 x 6.5 x 4.8 cm = volume: 165 mL. 9.9 cm
simple cyst is seen arising laterally from the lower pole.
Echogenicity within normal limits. No mass or hydronephrosis
visualized.

Left Kidney:

Renal measurements: 11.2 x 5.6 x 5.2 cm = volume: 169 mL. 2.6 cm
simple cyst is seen arising from lower pole. Echogenicity within
normal limits. No mass or hydronephrosis visualized.

Bladder:

Foley catheter is noted. Otherwise appears normal for degree of
distention.

Other:

Prostate is enlarged, measuring 5.9 x 5.4 x 5.3 cm with a calculated
volume of 89 cubic cm.
IMPRESSION: Bilateral simple renal cysts are noted. Kidneys are otherwise
unremarkable.

Moderate prostatic enlargement is noted as described above.

## 2022-02-22 ENCOUNTER — Ambulatory Visit (INDEPENDENT_AMBULATORY_CARE_PROVIDER_SITE_OTHER): Payer: Medicare Other | Admitting: Urology

## 2022-02-22 ENCOUNTER — Encounter: Payer: Self-pay | Admitting: Urology

## 2022-02-22 ENCOUNTER — Other Ambulatory Visit: Payer: Self-pay

## 2022-02-22 VITALS — BP 132/82 | HR 80 | Ht 69.0 in | Wt 185.0 lb

## 2022-02-22 DIAGNOSIS — Z125 Encounter for screening for malignant neoplasm of prostate: Secondary | ICD-10-CM | POA: Diagnosis not present

## 2022-02-22 DIAGNOSIS — N401 Enlarged prostate with lower urinary tract symptoms: Secondary | ICD-10-CM | POA: Diagnosis not present

## 2022-02-22 DIAGNOSIS — N138 Other obstructive and reflux uropathy: Secondary | ICD-10-CM

## 2022-02-22 LAB — BLADDER SCAN AMB NON-IMAGING

## 2022-02-22 NOTE — Progress Notes (Signed)
? ?  02/22/2022 ?12:59 PM  ? ?William Bennett ?[birth year] ?[id number] ? ?Reason for visit: Follow up BPH status post HOLEP, PSA screening ? ?HPI: ?I saw [name] back in urology clinic for follow-up after undergoing a HOLEP for Foley dependent urinary retention on 06/10/2020.  He also has a history of stroke.  80 g of tissue were removed with no evidence of malignancy, and there were moderate bladder trabeculations. ? ?He is voiding with a strong stream.  PVR is 36mL in clinic today.  He is having minimal stress incontinence that happens when doing heavy lifting, and he will wear a pad for safety.  Overall he is very happy with how he is doing.  PSA has dropped appropriately, most recently 0.67 in December 2022.  No further PSA screening needed per AUA guideline recommendations. ? ?Can follow-up with urology as needed ?  ? ?Billey Co, MD ? ?[facility] ?[address] ?[place], [place] [address] ?[phone] ? ?

## 2022-02-22 NOTE — Patient Instructions (Signed)
Kegel Exercises ?Kegel exercises can help strengthen your pelvic floor muscles. The pelvic floor is a group of muscles that support your rectum, small intestine, and bladder. In females, pelvic floor muscles also help support the uterus. These muscles help you control the flow of urine and stool (feces). ?Kegel exercises are painless and simple. They do not require any equipment. Your provider may suggest Kegel exercises to: ?Improve bladder and bowel control. ?Improve sexual response. ?Improve weak pelvic floor muscles after surgery to remove the uterus (hysterectomy) or after pregnancy, in females. ?Improve weak pelvic floor muscles after prostate gland removal or surgery, in males. ?Kegel exercises involve squeezing your pelvic floor muscles. These are the same muscles you squeeze when you try to stop the flow of urine or keep from passing gas. The exercises can be done while sitting, standing, or lying down, but it is best to vary your position. ?Ask your health care provider which exercises are safe for you. Do exercises exactly as told by your health care provider and adjust them as directed. Do not begin these exercises until told by your health care provider. ?Exercises ?How to do Kegel exercises: ?Squeeze your pelvic floor muscles tight. You should feel a tight lift in your rectal area. If you are a male, you should also feel a tightness in your vaginal area. Keep your stomach, buttocks, and legs relaxed. ?Hold the muscles tight for up to 10 seconds. ?Breathe normally. ?Relax your muscles for up to 10 seconds. ?Repeat as told by your health care provider. ?Repeat this exercise daily as told by your health care provider. Continue to do this exercise for at least 4-6 weeks, or for as long as told by your health care provider. ?You may be referred to a physical therapist who can help you learn more about how to do Kegel exercises. ?Depending on your condition, your health care provider may  recommend: ?Varying how long you squeeze your muscles. ?Doing several sets of exercises every day. ?Doing exercises for several weeks. ?Making Kegel exercises a part of your regular exercise routine. ?This information is not intended to replace advice given to you by your health care provider. Make sure you discuss any questions you have with your health care provider. ?Document Revised: 04/13/2021 Document Reviewed: 04/13/2021 ?Elsevier Patient Education ? 2022 Elsevier Inc. ? ?

## 2024-01-28 ENCOUNTER — Ambulatory Visit: Payer: Self-pay | Admitting: General Surgery

## 2024-01-28 NOTE — H&P (Signed)
History of Present Illness William Bennett. is a 78 year old male who presents with a right inguinal hernia. He was referred by Dr. Larwance Sachs for evaluation of a right inguinal hernia.  He has been experiencing pain in the right inguinal region for several months. The pain is intermittent, rated as a 3 out of 10, but has subsided since the appearance of a bulge. Approximately two weeks ago, he noticed a bulge in the right inguinal area that 'comes and goes'. The bulge becomes more prominent with activities such as bowel movements and straining, but often resolves spontaneously, especially when lying down. No pain is currently associated with the bulge. No pain on the left side, abdominal pain, or other symptoms. He has not had any previous abdominal surgeries.  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 He also participates in exercise routines twice a week, including core, upper, and lower body exercises with weights up to 20 pounds. He wants to continue these activities until mid-March, after which he plans to address the hernia surgically.      PAST MEDICAL HISTORY:  Past Medical History:  Diagnosis Date   Chickenpox    Diastolic dysfunction 2008   npormal stress ECHO 2007   Eczema, unspecified    topical cortisone helpful   History of stroke    thinks couple years ago   Hyperlipidemia 08/22/2012   Diet controlled    Low back pain with sciatica 08/20/2011   a.  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    b.  Evaluation by Dr. Mindi Curling at [facility].    c.  CT guided steroid epidural steroid injection, 3/07.    d.  Conservative management including physical therapy.     Paresthesia    neurology consult -resolved with position changes   Renal cyst    Sciatica 2006, 2010   right 2006, left 2010    Vasovagal syncope 08/2007        PAST SURGICAL HISTORY:   Past Surgical History:  Procedure Laterality Date   CHEILECTOMY  07/18/2003   Right first MTP    ORIF FINGER / THUMB FRACTURE  08/18/2003   Left thumb   COLONOSCOPY  12/18/2003   PROSTATE BIOPSY  10/17/2010   2 tissue cores with atypical small proliferation - Dr Doran Clay   SCREENING COLONOSCOPY N/A 06/24/2014   Procedure: SCREENING COLONOSCOPY;  Surgeon: Carren Rang, MD;  Location: [facility];  Service: Gastroenterology;  Laterality: N/A;   COLONOSCOPY W/REMOVAL LESIONS BY SNARE N/A 05/18/2020   Procedure: SURVEILLANCE COLONOSCOPY;  Surgeon: Morley Kos, MD;  Location: [facility];  Service: Gastroenterology;  Laterality: N/A;   EXTRACTION CATARACT EXTRACAPSULAR W/INSERTION INTRAOCULAR PROSTHESIS Right 05/01/2022   Procedure: R: LenSx/Vivity/ORA-DEXTENZA-EXTRACTION CATARACT EXTRACAPSULAR WITH PHACO WITH INSERTION INTRAOCULAR PROSTHESIS;  Surgeon: Lilyan Punt, MD;  Location: [facility];  Service: Ophthalmology;  Laterality: Right;   INSJ RX ELUTING IMPLT PUNCTAL DILAT LAC CANAL EA  Right 05/01/2022   Procedure: R-Insertion of drug-eluting implant, including punctal dilation when performed, into lacrimal canaliculus, each;  Surgeon: Lilyan Punt, MD;  Location: [facility];  Service: Ophthalmology;  Laterality: Right;   EXTRACTION CATARACT EXTRACAPSULAR W/INSERTION INTRAOCULAR PROSTHESIS Left 05/08/2022   Procedure: L-LenSx / VIVITY / ORA / DEXTENZA-EXTRACTION CATARACT EXTRACAPSULAR WITH PHACO WITH INSERTION INTRAOCULAR PROSTHESIS;  Surgeon: Lilyan Punt, MD;  Location: [facility];  Service: Ophthalmology;  Laterality: Left;   INSJ RX ELUTING IMPLT PUNCTAL DILAT LAC CANAL EA  Left 05/08/2022   Procedure: L-Insertion of drug-eluting implant, including punctal dilation when performed, into lacrimal canaliculus, each;  Surgeon: Lilyan Punt, MD;  Location: [facility];   Service: Ophthalmology;  Laterality: Left;   Foot surgery           MEDICATIONS:  Outpatient Encounter Medications as of 01/28/2024  Medication Sig Dispense Refill   aspirin 81 MG EC tablet Take 81 mg by mouth every morning        atorvastatin (LIPITOR) 40 MG tablet TAKE 1 TABLET BY MOUTH EVERY DAY 90 tablet 3   cetirizine (ZYRTEC) 10 MG tablet Take 10 mg by mouth once daily     No facility-administered encounter medications on file as of 01/28/2024.     ALLERGIES:   Penicillins   SOCIAL HISTORY:  Social History   Socioeconomic History   Marital status: Married  Occupational History   Occupation: Retired  Tobacco Use   Smoking status: Never   Smokeless tobacco: Never  Vaping Use   Vaping status: Never Used  Substance and Sexual Activity   Alcohol use: No   Drug use: No   Sexual activity: Defer   Social Drivers of Corporate investment banker Strain: Low Risk  (12/25/2023)   Overall Financial Resource Strain (CARDIA)    Difficulty of Paying Living Expenses: Not hard at all  Food Insecurity: No Food Insecurity (01/27/2024)   Hunger Vital Sign    Worried About Running Out of Food in the Last Year: Never true    Ran Out of Food in the Last Year: Never true  Transportation Needs: No Transportation Needs (01/27/2024)   PRAPARE - Administrator, Civil Service (Medical): No    Lack of Transportation (Non-Medical): No    FAMILY HISTORY:  Family History  Problem Relation Name Age of Onset   Coronary Artery Disease (Blocked arteries around heart) Mother     High blood pressure (Hypertension) Mother     Diabetes type II Mother     Coronary Artery Disease (Blocked arteries around heart) Father     Asthma Father     Depression Sister     Skin cancer Neg Hx     Colon cancer Neg Hx     Prostate cancer Neg Hx     Glaucoma Neg Hx     Macular degeneration Neg Hx     Anesthesia problems Neg Hx       GENERAL REVIEW OF SYSTEMS:   General ROS: negative for - chills,  fatigue, fever, weight gain or weight loss Allergy and Immunology ROS: negative for - hives  Hematological and Lymphatic ROS: negative for - bleeding problems or bruising, negative for palpable nodes Endocrine ROS: negative for - heat or cold intolerance, hair changes Respiratory ROS: negative for - cough, shortness of breath or wheezing Cardiovascular ROS: no chest pain or palpitations GI ROS: negative for nausea, vomiting, abdominal pain, diarrhea, constipation Musculoskeletal ROS: negative for - joint swelling or muscle pain Neurological ROS: negative for - confusion, syncope Dermatological ROS: negative for pruritus and rash  PHYSICAL EXAM:  Vitals:   01/28/24 0913  BP: (!) 144/69  Pulse: 73  .  Ht:172.7 cm (5\' 8" ) Wt:78.9 kg (174 lb) AVW:UJWJ surface area is 1.95 meters squared. Body mass index is 26.46 kg/m.Marland Kitchen   GENERAL: Alert, active, oriented x3  HEENT: Pupils equal reactive to light. Extraocular movements are intact. Sclera clear. Palpebral conjunctiva normal red color.Pharynx clear.  NECK: Supple with no palpable mass and no adenopathy.  LUNGS: Sound clear with no rales rhonchi or wheezes.  HEART: Regular rhythm S1 and S2 without murmur.  ABDOMEN: Soft and depressible, nontender with no palpable mass, no hepatomegaly.  Reducible bulge of the right inguinal hernia.  Debilitated left groin.  EXTREMITIES: Well-developed well-nourished symmetrical with no dependent edema.  NEUROLOGICAL: Awake alert oriented, facial expression symmetrical, moving all extremities.   Assessment & Plan Right Inguinal Hernia An intermittent bulge in the right inguinal region indicates a reducible hernia. Symptoms include a bulge with straining and previously mild pain, now resolved. Discussed risks of hernia progression, including potential incarceration and strangulation, which are low but possible. Surgical repair is the definitive treatment. Discussed laparoscopic approach, use of mesh, and  potential risks such as bleeding, infection, and chronic pain. Concerns about mesh quality were addressed, and agreement was reached to proceed with mesh repair due to its lower recurrence rates compared to suture repair. Surgery is preferred after basketball season and heavy lifting activities in mid-March. Waiting a month should not significantly increase the risk of complications. Schedule laparoscopic hernia repair for the third Wednesday of March. Avoid heavy lifting and strenuous activities until surgery. Monitor for signs of incarceration or strangulation and seek immediate medical attention if symptoms occur. Provide preoperative instructions and schedule a preoperative assessment.  Potential Left Inguinal Hernia No current symptoms or bulge on the left side, but there is potential for a small hernia. Opted for simultaneous repair of the left side during the right inguinal hernia surgery to avoid future surgery. Discussed risks of additional surgery, including increased risk of bleeding and infection, but prefer to proceed to avoid future procedures. Include left inguinal hernia repair during the scheduled laparoscopic surgery if a hernia is identified.    Non-recurrent unilateral inguinal hernia without obstruction or gangrene [K40.90]          Patient and his wife verbalized understanding, all questions were answered, and were agreeable with the plan outlined above.   Carolan Shiver, MD  Electronically signed by Carolan Shiver, MD

## 2024-02-18 ENCOUNTER — Encounter
Admission: RE | Admit: 2024-02-18 | Discharge: 2024-02-18 | Disposition: A | Discharge status: Home / Self Care | Payer: Medicare Other | Source: Ambulatory Visit | Attending: General Surgery | Admitting: General Surgery

## 2024-02-18 ENCOUNTER — Other Ambulatory Visit: Payer: Self-pay

## 2024-02-18 DIAGNOSIS — I63511 Cerebral infarction due to unspecified occlusion or stenosis of right middle cerebral artery: Secondary | ICD-10-CM

## 2024-02-18 HISTORY — DX: Hyperlipidemia, unspecified: E78.5

## 2024-02-18 HISTORY — DX: Cyst of kidney, acquired: N28.1

## 2024-02-18 HISTORY — DX: Other ill-defined heart diseases: I51.89

## 2024-02-18 HISTORY — DX: Unilateral inguinal hernia, without obstruction or gangrene, not specified as recurrent: K40.90

## 2024-02-18 HISTORY — DX: Migraine with aura, not intractable, without status migrainosus: G43.109

## 2024-02-18 HISTORY — DX: Hemiplegia and hemiparesis following cerebral infarction affecting left non-dominant side: I69.354

## 2024-02-18 NOTE — Patient Instructions (Addendum)
 Your procedure is scheduled on: 02/24/24 - Monday Report to the Registration Desk on the 1st floor of the [facility]. To find out your arrival time, please call [phone] between 1PM - 3PM on: 02/21/24 - Friday If your arrival time is 6:00 am, do not arrive before that time as the [facility] entrance doors do not open until 6:00 am.  REMEMBER: Instructions that are not followed completely may result in serious medical risk, up to and including death; or upon the discretion of your surgeon and anesthesiologist your surgery may need to be rescheduled.  Do not eat food or drink any liquids after midnight the night before surgery.  No gum chewing or hard candies.  One week prior to surgery: Stop Anti-inflammatories (NSAIDS) such as Advil, Aleve, Ibuprofen, Motrin, Naproxen, Naprosyn and Aspirin based products such as Excedrin, Goody's Powder, BC Powder. You may take Tylenol if needed for pain up until the day of surgery.  Stop ANY OVER THE COUNTER supplements until after surgery : Coenzyme Q10 , Multiple Vitamin .  HOLD aspirin 81 MG EC beginning 02/19/24.  ON THE DAY OF SURGERY ONLY TAKE THESE MEDICATIONS WITH SIPS OF WATER:  none   No Alcohol for 24 hours before or after surgery.  No Smoking including e-cigarettes for 24 hours before surgery.  No chewable tobacco products for at least 6 hours before surgery.  No nicotine patches on the day of surgery.  Do not use any "recreational" drugs for at least a week (preferably 2 weeks) before your surgery.  Please be advised that the combination of cocaine and anesthesia may have negative outcomes, up to and including death. If you test positive for cocaine, your surgery will be cancelled.  On the morning of surgery brush your teeth with toothpaste and water, you may rinse your mouth with mouthwash if you wish. Do not swallow any toothpaste or mouthwash.  Use CHG Soap or wipes as directed on instruction sheet.  Do not wear  jewelry, make-up, hairpins, clips or nail polish.  For welded (permanent) jewelry: bracelets, anklets, waist bands, etc.  Please have this removed prior to surgery.  If it is not removed, there is a chance that hospital personnel will need to cut it off on the day of surgery.  Do not wear lotions, powders, or perfumes.   Do not shave body hair from the neck down 48 hours before surgery.  Contact lenses, hearing aids and dentures may not be worn into surgery.  Do not bring valuables to the hospital. [facility] is not responsible for any missing/lost belongings or valuables.   Notify your doctor if there is any change in your medical condition (cold, fever, infection).  Wear comfortable clothing (specific to your surgery type) to the hospital.  After surgery, you can help prevent lung complications by doing breathing exercises.  Take deep breaths and cough every 1-2 hours. Your doctor may order a device called an Incentive Spirometer to help you take deep breaths. When coughing or sneezing, hold a pillow firmly against your incision with both hands. This is called "splinting." Doing this helps protect your incision. It also decreases belly discomfort.  If you are being admitted to the hospital overnight, leave your suitcase in the car. After surgery it may be brought to your room.  In case of increased patient census, it may be necessary for you, the patient, to continue your postoperative care in the Same Day Surgery department.  If you are being discharged the day  of surgery, you will not be allowed to drive home. You will need a responsible individual to drive you home and stay with you for 24 hours after surgery.   If you are taking public transportation, you will need to have a responsible individual with you.  Please call the Pre-admissions Testing Dept. at [phone] if you have any questions about these instructions.  Surgery Visitation Policy:  Patients having surgery or  a procedure may have two visitors.  Children under the age of 8 must have an adult with them who is not the patient.  Temporary Visitor Restrictions Due to increasing cases of flu, RSV and COVID-19: Children ages 49 and under will not be able to visit patients in [facility] hospitals under most circumstances.  Inpatient Visitation:    Visiting hours are 7 a.m. to 8 p.m. Up to four visitors are allowed at one time in a patient room. The visitors may rotate out with other people during the day.  One visitor age 10 or older may stay with the patient overnight and must be in the room by 8 p.m.     Preparing for Surgery with CHLORHEXIDINE GLUCONATE (CHG) Soap  Chlorhexidine Gluconate (CHG) Soap  o An antiseptic cleaner that kills germs and bonds with the skin to continue killing germs even after washing  o Used for showering the night before surgery and morning of surgery  Before surgery, you can play an important role by reducing the number of germs on your skin.  CHG (Chlorhexidine gluconate) soap is an antiseptic cleanser which kills germs and bonds with the skin to continue killing germs even after washing.  Please do not use if you have an allergy to CHG or antibacterial soaps. If your skin becomes reddened/irritated stop using the CHG.  1. Shower the NIGHT BEFORE SURGERY and the MORNING OF SURGERY with CHG soap.  2. If you choose to wash your hair, wash your hair first as usual with your normal shampoo.  3. After shampooing, rinse your hair and body thoroughly to remove the shampoo.  4. Use CHG as you would any other liquid soap. You can apply CHG directly to the skin and wash gently with a scrungie or a clean washcloth.  5. Apply the CHG soap to your body only from the neck down. Do not use on open wounds or open sores. Avoid contact with your eyes, ears, mouth, and genitals (private parts). Wash face and genitals (private parts) with your normal soap.  6. Wash thoroughly,  paying special attention to the area where your surgery will be performed.  7. Thoroughly rinse your body with warm water.  8. Do not shower/wash with your normal soap after using and rinsing off the CHG soap.  9. Pat yourself dry with a clean towel.  10. Wear clean pajamas to bed the night before surgery.  12. Place clean sheets on your bed the night of your first shower and do not sleep with pets.  13. Shower again with the CHG soap on the day of surgery prior to arriving at the hospital.  14. Do not apply any deodorants/lotions/powders.  15. Please wear clean clothes to the hospital.

## 2024-02-18 NOTE — Pre-Procedure Instructions (Signed)
 Patient reports cold symptoms. He has nasal congestion with a productive cough with yellow expectorant, he denies fever, body aches and chills., Feels like it has worsen since the onset of approximately a week ago, he is advise by this writer to call PCP now and report symptoms as he is scheduled for surgery 02/24/24.

## 2024-02-19 ENCOUNTER — Encounter
Admission: RE | Admit: 2024-02-19 | Discharge: 2024-02-19 | Disposition: A | Discharge status: Home / Self Care | Source: Ambulatory Visit | Attending: General Surgery | Admitting: General Surgery

## 2024-02-19 DIAGNOSIS — I63511 Cerebral infarction due to unspecified occlusion or stenosis of right middle cerebral artery: Secondary | ICD-10-CM | POA: Diagnosis not present

## 2024-02-19 DIAGNOSIS — Z01810 Encounter for preprocedural cardiovascular examination: Secondary | ICD-10-CM | POA: Insufficient documentation

## 2024-02-25 ENCOUNTER — Other Ambulatory Visit: Payer: Medicare Other

## 2024-03-03 ENCOUNTER — Encounter
Admission: RE | Admit: 2024-03-03 | Discharge: 2024-03-03 | Disposition: A | Discharge status: Home / Self Care | Payer: Medicare Other | Source: Ambulatory Visit | Attending: General Surgery | Admitting: General Surgery

## 2024-03-03 NOTE — Pre-Procedure Instructions (Signed)
 PAT follow up call completed , patient is informed to stop ASA after today, new surgery date reviewed, number provided to call for surgery time, patient has completed medications  prescribed for acute resp illness, voices that he is now without any upper resp illness.

## 2024-03-09 ENCOUNTER — Other Ambulatory Visit: Payer: Self-pay

## 2024-03-09 ENCOUNTER — Ambulatory Visit
Admission: RE | Admit: 2024-03-09 | Discharge: 2024-03-09 | Disposition: A | Discharge status: Home / Self Care | Payer: Medicare Other | Attending: General Surgery | Admitting: General Surgery

## 2024-03-09 ENCOUNTER — Encounter
Admission: RE | Disposition: A | Discharge status: Home / Self Care | Payer: Self-pay | Source: Home / Self Care | Attending: General Surgery

## 2024-03-09 ENCOUNTER — Ambulatory Visit: Payer: Self-pay | Admitting: Anesthesiology

## 2024-03-09 ENCOUNTER — Encounter: Payer: Self-pay | Admitting: General Surgery

## 2024-03-09 DIAGNOSIS — K4020 Bilateral inguinal hernia, without obstruction or gangrene, not specified as recurrent: Secondary | ICD-10-CM | POA: Insufficient documentation

## 2024-03-09 DIAGNOSIS — G709 Myoneural disorder, unspecified: Secondary | ICD-10-CM | POA: Insufficient documentation

## 2024-03-09 DIAGNOSIS — Z8673 Personal history of transient ischemic attack (TIA), and cerebral infarction without residual deficits: Secondary | ICD-10-CM | POA: Diagnosis not present

## 2024-03-09 HISTORY — PX: XI ROBOTIC ASSISTED INGUINAL HERNIA REPAIR WITH MESH: SHX6706

## 2024-03-09 SURGERY — REPAIR, HERNIA, INGUINAL, ROBOT-ASSISTED, LAPAROSCOPIC, USING MESH
Anesthesia: General | Laterality: Bilateral

## 2024-03-09 MED ORDER — EPHEDRINE 5 MG/ML INJ
INTRAVENOUS | Status: AC
  Filled 2024-03-09: qty 5

## 2024-03-09 MED ORDER — CHLORHEXIDINE GLUCONATE 0.12 % MT SOLN
OROMUCOSAL | Status: AC
  Filled 2024-03-09: qty 15

## 2024-03-09 MED ORDER — DEXAMETHASONE SODIUM PHOSPHATE 10 MG/ML IJ SOLN
INTRAMUSCULAR | Status: AC
  Filled 2024-03-09: qty 1

## 2024-03-09 MED ORDER — LIDOCAINE HCL (PF) 2 % IJ SOLN
INTRAMUSCULAR | Status: AC
  Filled 2024-03-09: qty 5

## 2024-03-09 MED ORDER — SUGAMMADEX SODIUM 200 MG/2ML IV SOLN
INTRAVENOUS | Status: DC | PRN
  Administered 2024-03-09: 160 mg via INTRAVENOUS

## 2024-03-09 MED ORDER — OXYCODONE HCL 5 MG PO TABS: 5.0000 mg | ORAL_TABLET | Freq: Once | ORAL | Status: DC | PRN

## 2024-03-09 MED ORDER — EPHEDRINE SULFATE-NACL 50-0.9 MG/10ML-% IV SOSY
PREFILLED_SYRINGE | INTRAVENOUS | Status: DC | PRN
  Administered 2024-03-09: 10 mg via INTRAVENOUS

## 2024-03-09 MED ORDER — PHENYLEPHRINE 80 MCG/ML (10ML) SYRINGE FOR IV PUSH (FOR BLOOD PRESSURE SUPPORT)
PREFILLED_SYRINGE | INTRAVENOUS | Status: AC
  Filled 2024-03-09: qty 10

## 2024-03-09 MED ORDER — ONDANSETRON HCL 4 MG/2ML IJ SOLN
INTRAMUSCULAR | Status: AC
  Filled 2024-03-09: qty 2

## 2024-03-09 MED ORDER — 0.9 % SODIUM CHLORIDE (POUR BTL) OPTIME
TOPICAL | Status: DC | PRN
  Administered 2024-03-09: 500 mL

## 2024-03-09 MED ORDER — HYDROCODONE-ACETAMINOPHEN 5-325 MG PO TABS: 1.0000 | ORAL_TABLET | Freq: Four times a day (QID) | ORAL | 0 refills | Status: AC | PRN

## 2024-03-09 MED ORDER — CEFAZOLIN SODIUM-DEXTROSE 2-4 GM/100ML-% IV SOLN
2.0000 g | INTRAVENOUS | Status: AC
Start: 2024-03-09 — End: 2024-03-09
  Administered 2024-03-09: 2 g via INTRAVENOUS

## 2024-03-09 MED ORDER — OXYCODONE HCL 5 MG/5ML PO SOLN: 5.0000 mg | Freq: Once | ORAL | Status: DC | PRN

## 2024-03-09 MED ORDER — ACETAMINOPHEN 10 MG/ML IV SOLN
INTRAVENOUS | Status: DC | PRN
  Administered 2024-03-09: 1000 mg via INTRAVENOUS

## 2024-03-09 MED ORDER — FENTANYL CITRATE (PF) 100 MCG/2ML IJ SOLN
25.0000 ug | INTRAMUSCULAR | Status: DC | PRN
  Administered 2024-03-09 (×2): 25 ug via INTRAVENOUS

## 2024-03-09 MED ORDER — FENTANYL CITRATE (PF) 100 MCG/2ML IJ SOLN
INTRAMUSCULAR | Status: AC
  Filled 2024-03-09: qty 2

## 2024-03-09 MED ORDER — ACETAMINOPHEN 10 MG/ML IV SOLN
INTRAVENOUS | Status: AC
  Filled 2024-03-09: qty 100

## 2024-03-09 MED ORDER — CEFAZOLIN SODIUM-DEXTROSE 2-4 GM/100ML-% IV SOLN
INTRAVENOUS | Status: AC
  Filled 2024-03-09: qty 100

## 2024-03-09 MED ORDER — PHENYLEPHRINE 80 MCG/ML (10ML) SYRINGE FOR IV PUSH (FOR BLOOD PRESSURE SUPPORT)
PREFILLED_SYRINGE | INTRAVENOUS | Status: DC | PRN
  Administered 2024-03-09: 80 ug via INTRAVENOUS

## 2024-03-09 MED ORDER — ORAL CARE MOUTH RINSE: 15.0000 mL | Freq: Once | OROMUCOSAL | Status: AC

## 2024-03-09 MED ORDER — BUPIVACAINE-EPINEPHRINE 0.25% -1:200000 IJ SOLN
INTRAMUSCULAR | Status: DC | PRN
  Administered 2024-03-09: 30 mL

## 2024-03-09 MED ORDER — DEXAMETHASONE SODIUM PHOSPHATE 10 MG/ML IJ SOLN
INTRAMUSCULAR | Status: DC | PRN
  Administered 2024-03-09: 10 mg via INTRAVENOUS

## 2024-03-09 MED ORDER — ONDANSETRON HCL 4 MG/2ML IJ SOLN
INTRAMUSCULAR | Status: DC | PRN
  Administered 2024-03-09: 4 mg via INTRAVENOUS

## 2024-03-09 MED ORDER — CHLORHEXIDINE GLUCONATE 0.12 % MT SOLN
15.0000 mL | Freq: Once | OROMUCOSAL | Status: AC
  Administered 2024-03-09: 15 mL via OROMUCOSAL

## 2024-03-09 MED ORDER — ROCURONIUM BROMIDE 10 MG/ML (PF) SYRINGE
PREFILLED_SYRINGE | INTRAVENOUS | Status: AC
  Filled 2024-03-09: qty 10

## 2024-03-09 MED ORDER — LACTATED RINGERS IV SOLN
INTRAVENOUS | Status: DC
Start: 2024-03-09 — End: 2024-03-09

## 2024-03-09 MED ORDER — BUPIVACAINE-EPINEPHRINE (PF) 0.25% -1:200000 IJ SOLN
INTRAMUSCULAR | Status: AC
  Filled 2024-03-09: qty 30

## 2024-03-09 MED ORDER — FENTANYL CITRATE (PF) 100 MCG/2ML IJ SOLN
INTRAMUSCULAR | Status: DC | PRN
Start: 2024-03-09 — End: 2024-03-09
  Administered 2024-03-09 (×2): 50 ug via INTRAVENOUS

## 2024-03-09 MED ORDER — ROCURONIUM BROMIDE 100 MG/10ML IV SOLN
INTRAVENOUS | Status: DC | PRN
  Administered 2024-03-09: 60 mg via INTRAVENOUS
  Administered 2024-03-09: 10 mg via INTRAVENOUS

## 2024-03-09 MED ORDER — PROPOFOL 10 MG/ML IV BOLUS
INTRAVENOUS | Status: DC | PRN
Start: 2024-03-09 — End: 2024-03-09
  Administered 2024-03-09: 100 mg via INTRAVENOUS

## 2024-03-09 MED ORDER — LIDOCAINE HCL (CARDIAC) PF 100 MG/5ML IV SOSY
PREFILLED_SYRINGE | INTRAVENOUS | Status: DC | PRN
  Administered 2024-03-09: 60 mg via INTRAVENOUS

## 2024-03-09 MED ORDER — PROPOFOL 10 MG/ML IV BOLUS
INTRAVENOUS | Status: AC
  Filled 2024-03-09: qty 20

## 2024-03-09 SURGICAL SUPPLY — 44 items
BAG PRESSURE INF REUSE 1000 (BAG) IMPLANT
COVER TIP SHEARS 8 DVNC (MISCELLANEOUS) ×1 IMPLANT
COVER WAND RF STERILE (DRAPES) ×1 IMPLANT
DERMABOND ADVANCED .7 DNX12 (GAUZE/BANDAGES/DRESSINGS) ×1 IMPLANT
DRAPE ARM DVNC X/XI (DISPOSABLE) ×3 IMPLANT
DRAPE COLUMN DVNC XI (DISPOSABLE) ×1 IMPLANT
ELECT REM PT RETURN 9FT ADLT (ELECTROSURGICAL) ×1 IMPLANT
ELECTRODE REM PT RTRN 9FT ADLT (ELECTROSURGICAL) ×1 IMPLANT
FORCEPS BPLR FENES DVNC XI (FORCEP) ×1 IMPLANT
GLOVE BIO SURGEON STRL SZ 6.5 (GLOVE) ×2 IMPLANT
GLOVE BIOGEL PI IND STRL 6.5 (GLOVE) ×2 IMPLANT
GOWN STRL REUS W/ TWL LRG LVL3 (GOWN DISPOSABLE) ×3 IMPLANT
IRRIGATOR SUCT 8 DISP DVNC XI (IRRIGATION / IRRIGATOR) IMPLANT
IV CATH ANGIO 12GX3 LT BLUE (NEEDLE) IMPLANT
IV NS 1000ML BAXH (IV SOLUTION) IMPLANT
KIT PINK PAD W/HEAD ARE REST (MISCELLANEOUS) ×1 IMPLANT
KIT PINK PAD W/HEAD ARM REST (MISCELLANEOUS) ×1 IMPLANT
LABEL OR SOLS (LABEL) IMPLANT
MANIFOLD NEPTUNE II (INSTRUMENTS) ×1 IMPLANT
MESH 3DMAX MID 5X7 LT XLRG (Mesh General) IMPLANT
MESH 3DMAX MID 5X7 RT XLRG (Mesh General) IMPLANT
NDL DRIVE SUT CUT DVNC (INSTRUMENTS) ×1 IMPLANT
NDL HYPO 22X1.5 SAFETY MO (MISCELLANEOUS) ×1 IMPLANT
NDL INSUFFLATION 14GA 120MM (NEEDLE) ×1 IMPLANT
NEEDLE DRIVE SUT CUT DVNC (INSTRUMENTS) ×1 IMPLANT
NEEDLE HYPO 22X1.5 SAFETY MO (MISCELLANEOUS) ×1 IMPLANT
NEEDLE INSUFFLATION 14GA 120MM (NEEDLE) ×1 IMPLANT
OBTURATOR OPTICAL STND 8 DVNC (TROCAR) ×1 IMPLANT
OBTURATOR OPTICALSTD 8 DVNC (TROCAR) ×1 IMPLANT
PACK LAP CHOLECYSTECTOMY (MISCELLANEOUS) ×1 IMPLANT
SCISSORS MNPLR CVD DVNC XI (INSTRUMENTS) ×1 IMPLANT
SEAL UNIV 5-12 XI (MISCELLANEOUS) ×3 IMPLANT
SET TUBE SMOKE EVAC HIGH FLOW (TUBING) ×1 IMPLANT
SOL ELECTROSURG ANTI STICK (MISCELLANEOUS) ×1 IMPLANT
SOLUTION ELECTROSURG ANTI STCK (MISCELLANEOUS) ×1 IMPLANT
SUT MNCRL 4-0 27 PS-2 XMFL (SUTURE) ×1 IMPLANT
SUT STRATA 2-0 23CM CT-2 (SUTURE) ×1 IMPLANT
SUT VIC AB 2-0 SH 27XBRD (SUTURE) ×1 IMPLANT
SUT VIC AB 3-0 SH 27X BRD (SUTURE) IMPLANT
SUTURE MNCRL 4-0 27XMF (SUTURE) ×1 IMPLANT
TAPE TRANSPORE STRL 2 31045 (GAUZE/BANDAGES/DRESSINGS) IMPLANT
TRAP FLUID SMOKE EVACUATOR (MISCELLANEOUS) ×1 IMPLANT
TRAY FOLEY MTR SLVR 16FR STAT (SET/KITS/TRAYS/PACK) ×1 IMPLANT
WATER STERILE IRR 500ML POUR (IV SOLUTION) ×1 IMPLANT

## 2024-03-09 NOTE — Op Note (Signed)
 Preoperative diagnosis: Bilateral inguinal hernia.   Postoperative diagnosis: Bilateral inguinal hernia.  Procedure: Robotic assisted Laparoscopic Transabdominal preperitoneal laparoscopic (TAPP) repair of bilateral inguinal hernia.  Anesthesia: GETA  Surgeon: Dr. Hazle Quant  Wound Classification: Clean  Indications:  Patient is a 78 y.o. male developed a symptomatic bilateral inguinal hernia. Repair was indicated.  Findings: 1. Bilateral Indirect Inguinal hernia identified 2. Vas deferens and cord structures identified and preserved 3. Bard Extra Large 3D Max MID Anatomical mesh used for repair 4. Adequate hemostasis.   Description of procedure:  The patient was taken to the operating room and the correct side of surgery was verified. The patient was placed supine with right arm tucked at the side. After obtaining adequate anesthesia, the patient's abdomen was prepped and draped in standard sterile fashion. A time-out was completed verifying correct patient, procedure, site, positioning, and implant(s) and/or special equipment prior to beginning this procedure.  An incision was made in a natural skin line above the umbilicus. The fascia was elevated and the Veress needle inserted. Proper position was confirmed by aspiration and saline meniscus test.  The abdomen was insufflated with carbon dioxide to a pressure of 15 mmHg. The patient tolerated insufflation well.  Abdominal cavity was entered using Optiview technique with a millimeter trocar.  No injury was identified.  Another 2 mm trocars were placed lateral to each rectus muscle.  Scissors and bipolar forceps were inserted under direct visualization. At the robotic console both hernias were fixed using the following technique: Transverse peritoneal incision is made about 8 cm superior to the inguinal defect. Medial to the epigastric vessels, the parietal compartment is dissected to visualize the rectus muscle. This is carried down to  the symphysis pubis and the retropubic space is dissected to expose at least 2 cm contralateral to the midline. Cooper's ligament is exposed and cleared at least 2 cm below the ligament to ensure adequate space for the inferior border of the mesh. Hesselbach's triangle is cleared assessing for a direct hernia.  Lateral to the epigastric vessels, the dissection is carried out in visceral compartment continuing in the true preperitoneal plane. Indirect hernia sac, was carefully reduced and separated from the cord structures with medial retraction and a combination of blunt/sharp dissection and focused cautery. This dissection was continued until the cord structures are "parietalized" completely, allowing for visualization of the reflected peritoneum that is continuous with the line originating 2 cm below Coopers medially and across the psoas muscle in the lateral compartment.  The internal ring was interrogated for a cord lipoma. The cord lipoma was reduced to the retroperitoneum and seated dorsal to the preperitoneal mesh. Having achieved a complete dissection with a critical view of the entire myopectineal orifice on both sides, a Right and Left XL mesh were then positioned centered at the iliopubic tract with the medial side crossing the midline and the inferior edge positioned 2 cm below Coopers ligament. The lateral aspect of the mesh extended 3-5 cm beyond the lateral edge of the psoas. The mesh is fixated using an interrupted suture placed to the ipsilateral Coopers ligament. A second suture was done at the medial superior aspect of the mesh fixating this to the rectus complex.  The peritoneal flap is closed with running barbed suture. Additional holes in the peritoneum closed with suture. Preperitoneal space gas aspirated to visualize the peritoneum apposed directly against the mesh and ensure no folding, lifting, or buckling of the mesh. Skin is closed, sterile dressings are applied.  The  patient tolerated  the procedure well and was taken to the postanesthesia care unit in stable condition  Specimen: None  Complications: None  Estimated Blood Loss: 5 mL

## 2024-03-09 NOTE — Transfer of Care (Signed)
 Immediate Anesthesia Transfer of Care Note  Patient: William Bennett.  Procedure(s) Performed: REPAIR, HERNIA, INGUINAL, ROBOT-ASSISTED, LAPAROSCOPIC, USING MESH (Bilateral)  Patient Location: PACU  Anesthesia Type:General  Level of Consciousness: awake and drowsy  Airway & Oxygen Therapy: Patient Spontanous Breathing and Patient connected to face mask oxygen  Post-op Assessment: Report given to RN and Post -op Vital signs reviewed and stable  Post vital signs: Reviewed and stable  Last Vitals:  Vitals Value Taken Time  BP 144/72 03/09/24 0932  Temp 35.9 0932  Pulse 71 03/09/24 0936  Resp 12 03/09/24 0936  SpO2 100 % 03/09/24 0936  Vitals shown include unfiled device data.  Last Pain:  Vitals:   03/09/24 0643  TempSrc: Temporal  PainSc: 0-No pain         Complications: No notable events documented.

## 2024-03-09 NOTE — Anesthesia Procedure Notes (Signed)
 Procedure Name: Intubation Date/Time: 03/09/2024 7:38 AM  Performed by: Morene Crocker, CRNAPre-anesthesia Checklist: Patient identified, Patient being monitored, Timeout performed, Emergency Drugs available and Suction available Patient Re-evaluated:Patient Re-evaluated prior to induction Oxygen Delivery Method: Circle system utilized Preoxygenation: Pre-oxygenation with 100% oxygen Induction Type: IV induction Ventilation: Mask ventilation without difficulty Laryngoscope Size: 3 and McGrath Grade View: Grade I Tube type: Oral Tube size: 7.0 mm Number of attempts: 1 Airway Equipment and Method: Stylet Placement Confirmation: ETT inserted through vocal cords under direct vision, positive ETCO2 and breath sounds checked- equal and bilateral Secured at: 22 cm Tube secured with: Tape Dental Injury: Teeth and Oropharynx as per pre-operative assessment  Comments: Smooth atraumatic intubation, no complications noted.

## 2024-03-09 NOTE — Anesthesia Postprocedure Evaluation (Signed)
 Anesthesia Post Note  Patient: William Bennett.  Procedure(s) Performed: REPAIR, HERNIA, INGUINAL, ROBOT-ASSISTED, LAPAROSCOPIC, USING MESH (Bilateral)  Patient location during evaluation: PACU Anesthesia Type: General Level of consciousness: awake and alert Pain management: pain level controlled Vital Signs Assessment: post-procedure vital signs reviewed and stable Respiratory status: spontaneous breathing, nonlabored ventilation, respiratory function stable and patient connected to nasal cannula oxygen Cardiovascular status: blood pressure returned to baseline and stable Postop Assessment: no apparent nausea or vomiting Anesthetic complications: no   No notable events documented.   Last Vitals:  Vitals:   03/09/24 1022 03/09/24 1130  BP: (!) 145/73 (!) 148/86  Pulse: 64 64  Resp: 16 16  Temp: (!) 36.2 C   SpO2: 100% 100%    Last Pain:  Vitals:   03/09/24 1130  TempSrc:   PainSc: 2                  Cleda Mccreedy Cuma Polyakov

## 2024-03-09 NOTE — Discharge Instructions (Addendum)

## 2024-03-09 NOTE — H&P (Signed)
 History of Present Illness William Bennett. is a 78 year old male who presents with a right inguinal hernia. He was referred by Dr. Larwance Sachs for evaluation of a right inguinal hernia.  He has been experiencing pain in the right inguinal region for several months. The pain is intermittent, rated as a 3 out of 10, but has subsided since the appearance of a bulge. Approximately two weeks ago, he noticed a bulge in the right inguinal area that 'comes and goes'. The bulge becomes more prominent with activities such as bowel movements and straining, but often resolves spontaneously, especially when lying down. No pain is currently associated with the bulge. No pain on the left side, abdominal pain, or other symptoms. He has not had any previous abdominal surgeries.  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 He also participates in exercise routines twice a week, including core, upper, and lower body exercises with weights up to 20 pounds. He wants to continue these activities until mid-March, after which he plans to address the hernia surgically.   PAST MEDICAL HISTORY:  Past Medical History:  Diagnosis Date  Chickenpox  Diastolic dysfunction 2008  npormal stress ECHO 2007  Eczema, unspecified  topical cortisone helpful  History of stroke  thinks couple years ago  Hyperlipidemia 08/22/2012  Diet controlled  Low back pain with sciatica 08/20/2011  a. MRI - multi level degenerative disk disease, most pronounced involving the lower lumbar spine; majority of the findings are eccentric to the left, 3/07. b. Evaluation by Dr. Mindi Curling at [facility]. c. CT guided steroid epidural steroid injection, 3/07. d. Conservative management including physical therapy.  Paresthesia  neurology consult -resolved with position changes  Renal cyst  Sciatica 2006, 2010  right 2006, left 2010  Vasovagal syncope 08/2007     PAST SURGICAL  HISTORY:  Past Surgical History:  Procedure Laterality Date  CHEILECTOMY 07/18/2003  Right first MTP  ORIF FINGER / THUMB FRACTURE 08/18/2003  Left thumb  COLONOSCOPY 12/18/2003  PROSTATE BIOPSY 10/17/2010  2 tissue cores with atypical small proliferation - Dr Doran Clay  SCREENING COLONOSCOPY N/A 06/24/2014  Procedure: SCREENING COLONOSCOPY; Surgeon: Carren Rang, MD; Location: [facility]; Service: Gastroenterology; Laterality: N/A;  COLONOSCOPY W/REMOVAL LESIONS BY SNARE N/A 05/18/2020  Procedure: SURVEILLANCE COLONOSCOPY; Surgeon: Morley Kos, MD; Location: [facility]; Service: Gastroenterology; Laterality: N/A;  EXTRACTION CATARACT EXTRACAPSULAR W/INSERTION INTRAOCULAR PROSTHESIS Right 05/01/2022  Procedure: R: LenSx/Vivity/ORA-DEXTENZA-EXTRACTION CATARACT EXTRACAPSULAR WITH PHACO WITH INSERTION INTRAOCULAR PROSTHESIS; Surgeon: Lilyan Punt, MD; Location: [facility]; Service: Ophthalmology; Laterality: Right;  INSJ RX ELUTING IMPLT PUNCTAL DILAT LAC CANAL EA Right 05/01/2022  Procedure: R-Insertion of drug-eluting implant, including punctal dilation when performed, into lacrimal canaliculus, each; Surgeon: Lilyan Punt, MD; Location: [facility]; Service: Ophthalmology; Laterality: Right;  EXTRACTION CATARACT EXTRACAPSULAR W/INSERTION INTRAOCULAR PROSTHESIS Left 05/08/2022  Procedure: L-LenSx / Leda Quail / ORA / DEXTENZA-EXTRACTION CATARACT EXTRACAPSULAR WITH PHACO WITH INSERTION INTRAOCULAR PROSTHESIS; Surgeon: Lilyan Punt, MD; Location: [facility]; Service: Ophthalmology; Laterality: Left;  INSJ RX ELUTING IMPLT PUNCTAL DILAT LAC CANAL EA Left 05/08/2022  Procedure: L-Insertion of drug-eluting implant, including punctal dilation when performed, into lacrimal canaliculus, each; Surgeon: Lilyan Punt, MD; Location: [facility]; Service: Ophthalmology; Laterality: Left;  Foot surgery    MEDICATIONS:  Outpatient Encounter  Medications as of 01/28/2024  Medication Sig Dispense Refill  aspirin 81 MG EC tablet Take 81 mg by mouth every morning  atorvastatin (LIPITOR) 40  MG tablet TAKE 1 TABLET BY MOUTH EVERY DAY 90 tablet 3  cetirizine (ZYRTEC) 10 MG tablet Take 10 mg by mouth once daily   No facility-administered encounter medications on file as of 01/28/2024.    ALLERGIES:  Penicillins  SOCIAL HISTORY:  Social History   Socioeconomic History  Marital status: Married  Occupational History  Occupation: Retired  Tobacco Use  Smoking status: Never  Smokeless tobacco: Never  Vaping Use  Vaping status: Never Used  Substance and Sexual Activity  Alcohol use: No  Drug use: No  Sexual activity: Defer   Social Drivers of Corporate investment banker Strain: Low Risk (12/25/2023)  Overall Financial Resource Strain (CARDIA)  Difficulty of Paying Living Expenses: Not hard at all  Food Insecurity: No Food Insecurity (01/27/2024)  Hunger Vital Sign  Worried About Running Out of Food in the Last Year: Never true  Ran Out of Food in the Last Year: Never true  Transportation Needs: No Transportation Needs (01/27/2024)  PRAPARE - Risk analyst (Medical): No  Lack of Transportation (Non-Medical): No   FAMILY HISTORY:  Family History  Problem Relation Name Age of Onset  Coronary Artery Disease (Blocked arteries around heart) Mother  High blood pressure (Hypertension) Mother  Diabetes type II Mother  Coronary Artery Disease (Blocked arteries around heart) Father  Asthma Father  Depression Sister  Skin cancer Neg Hx  Colon cancer Neg Hx  Prostate cancer Neg Hx  Glaucoma Neg Hx  Macular degeneration Neg Hx  Anesthesia problems Neg Hx    GENERAL REVIEW OF SYSTEMS:   General ROS: negative for - chills, fatigue, fever, weight gain or weight loss Allergy and Immunology ROS: negative for - hives  Hematological and Lymphatic ROS: negative for - bleeding problems or bruising, negative  for palpable nodes Endocrine ROS: negative for - heat or cold intolerance, hair changes Respiratory ROS: negative for - cough, shortness of breath or wheezing Cardiovascular ROS: no chest pain or palpitations GI ROS: negative for nausea, vomiting, abdominal pain, diarrhea, constipation Musculoskeletal ROS: negative for - joint swelling or muscle pain Neurological ROS: negative for - confusion, syncope Dermatological ROS: negative for pruritus and rash  PHYSICAL EXAM:  Vitals:  01/28/24 0913  BP: (!) 144/69  Pulse: 73  .  Ht:172.7 cm (5\' 8" ) Wt:78.9 kg (174 lb) ZOX:WRUE surface area is 1.95 meters squared. Body mass index is 26.46 kg/m.Marland Kitchen  GENERAL: Alert, active, oriented x3  HEENT: Pupils equal reactive to light. Extraocular movements are intact. Sclera clear. Palpebral conjunctiva normal red color.Pharynx clear.  NECK: Supple with no palpable mass and no adenopathy.  LUNGS: Sound clear with no rales rhonchi or wheezes.  HEART: Regular rhythm S1 and S2 without murmur.  ABDOMEN: Soft and depressible, nontender with no palpable mass, no hepatomegaly. Reducible bulge of the right inguinal hernia. Debilitated left groin.  EXTREMITIES: Well-developed well-nourished symmetrical with no dependent edema.  NEUROLOGICAL: Awake alert oriented, facial expression symmetrical, moving all extremities.  Assessment & Plan Right Inguinal Hernia An intermittent bulge in the right inguinal region indicates a reducible hernia. Symptoms include a bulge with straining and previously mild pain, now resolved. Discussed risks of hernia progression, including potential incarceration and strangulation, which are low but possible. Surgical repair is the definitive treatment. Discussed laparoscopic approach, use of mesh, and potential risks such as bleeding, infection, and chronic pain. Concerns about mesh quality were addressed, and agreement was reached to proceed with mesh repair due to its lower recurrence  rates compared  to suture repair. Surgery is preferred after basketball season and heavy lifting activities in mid-March. Waiting a month should not significantly increase the risk of complications. Schedule laparoscopic hernia repair for the third Wednesday of March. Avoid heavy lifting and strenuous activities until surgery. Monitor for signs of incarceration or strangulation and seek immediate medical attention if symptoms occur. Provide preoperative instructions and schedule a preoperative assessment.  Potential Left Inguinal Hernia No current symptoms or bulge on the left side, but there is potential for a small hernia. Opted for simultaneous repair of the left side during the right inguinal hernia surgery to avoid future surgery. Discussed risks of additional surgery, including increased risk of bleeding and infection, but prefer to proceed to avoid future procedures. Include left inguinal hernia repair during the scheduled laparoscopic surgery if a hernia is identified.  Non-recurrent unilateral inguinal hernia without obstruction or gangrene [K40.90]   Patient and his wife verbalized understanding, all questions were answered, and were agreeable with the plan outlined above.   Carolan Shiver, MD

## 2024-03-09 NOTE — Anesthesia Preprocedure Evaluation (Signed)
 Anesthesia Evaluation  Patient identified by MRN, date of birth, ID band Patient awake    Reviewed: Allergy & Precautions, NPO status , Patient's Chart, lab work & pertinent test results  History of Anesthesia Complications Negative for: history of anesthetic complications  Airway Mallampati: III  TM Distance: <3 FB Neck ROM: full    Dental  (+) Chipped   Pulmonary neg pulmonary ROS, neg shortness of breath   Pulmonary exam normal        Cardiovascular Exercise Tolerance: Good (-) angina (-) Past MI and (-) DOE Normal cardiovascular exam     Neuro/Psych  Headaches  Neuromuscular disease CVA  negative psych ROS   GI/Hepatic negative GI ROS, Neg liver ROS,neg GERD  ,,  Endo/Other  negative endocrine ROS    Renal/GU Renal disease     Musculoskeletal   Abdominal   Peds  Hematology negative hematology ROS (+)   Anesthesia Other Findings Past Medical History: No date: BPH (benign prostatic hyperplasia) No date: Diastolic dysfunction No date: Heart murmur     Comment:  as child No date: 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) No date: HLD (hyperlipidemia) No date: Inguinal hernia of right side without obstruction or gangrene No date: Migraine with visual aura No date: Renal cyst     Comment:  bilateral No date: Stroke [facility])     Comment:  TIA  Past Surgical History: No date: COLONOSCOPY W/ POLYPECTOMY No date: EYE SURGERY; Bilateral     Comment:  lens replaced 06/10/2020: HOLEP-LASER ENUCLEATION OF THE PROSTATE WITH  MORCELLATION; N/A     Comment:  Procedure: HOLEP-LASER ENUCLEATION OF THE PROSTATE WITH               MORCELLATION;  Surgeon: Sondra Come, MD;  Location:               [facility];  Service: Urology;  Laterality: N/A; No date: thumb surgery; Left     Comment:  ORIF   No date: TOE SURGERY; Right  BMI    Body Mass Index: 25.85 kg/m       Reproductive/Obstetrics negative OB ROS                             Anesthesia Physical Anesthesia Plan  ASA: 3  Anesthesia Plan: General ETT   Post-op Pain Management:    Induction: Intravenous  PONV Risk Score and Plan: Ondansetron, Dexamethasone, Midazolam and Treatment may vary due to age or medical condition  Airway Management Planned: Oral ETT  Additional Equipment:   Intra-op Plan:   Post-operative Plan: Extubation in OR  Informed Consent: I have reviewed the patients History and Physical, chart, labs and discussed the procedure including the risks, benefits and alternatives for the proposed anesthesia with the patient or authorized representative who has indicated his/her understanding and acceptance.     Dental Advisory Given  Plan Discussed with: Anesthesiologist, CRNA and Surgeon  Anesthesia Plan Comments: (Patient consented for risks of anesthesia including but not limited to:  - adverse reactions to medications - damage to eyes, teeth, lips or other oral mucosa - nerve damage due to positioning  - sore throat or hoarseness - Damage to heart, brain, nerves, lungs, other parts of body or loss of life  Patient voiced understanding and assent.)       Anesthesia Quick Evaluation

## 2024-03-10 ENCOUNTER — Encounter: Payer: Self-pay | Admitting: General Surgery
